# Patient Record
Sex: Male | Born: 1960 | Race: Black or African American | Hispanic: No | Smoking: Former smoker
Health system: Southern US, Community
[De-identification: ages and names within clinical notes are randomized; demographics above are authoritative.]

## PROBLEM LIST (undated history)

## (undated) DIAGNOSIS — J309 Allergic rhinitis, unspecified: Secondary | ICD-10-CM

## (undated) DIAGNOSIS — K219 Gastro-esophageal reflux disease without esophagitis: Secondary | ICD-10-CM

## (undated) HISTORY — PX: BACK SURGERY: SHX140

## (undated) HISTORY — PX: LAMINECTOMY AND MICRODISCECTOMY LUMBAR SPINE: SHX1913

## (undated) HISTORY — PX: HERNIA REPAIR: SHX51

## (undated) HISTORY — PX: FRACTURE SURGERY: SHX138

## (undated) HISTORY — PX: EYE SURGERY: SHX253

## (undated) HISTORY — PX: ORIF ANKLE FRACTURE: SUR919

---

## 2004-07-03 ENCOUNTER — Ambulatory Visit: Payer: Self-pay | Admitting: Unknown Physician Specialty

## 2006-02-08 ENCOUNTER — Emergency Department: Payer: Self-pay | Admitting: Internal Medicine

## 2006-11-07 ENCOUNTER — Emergency Department: Payer: Self-pay

## 2006-11-07 ENCOUNTER — Other Ambulatory Visit: Payer: Self-pay

## 2008-02-11 ENCOUNTER — Other Ambulatory Visit: Payer: Self-pay

## 2008-02-11 ENCOUNTER — Emergency Department: Payer: Self-pay | Admitting: Emergency Medicine

## 2008-06-09 ENCOUNTER — Emergency Department: Payer: Self-pay

## 2009-08-15 ENCOUNTER — Emergency Department: Payer: Self-pay | Admitting: Emergency Medicine

## 2010-06-05 ENCOUNTER — Emergency Department: Payer: Self-pay | Admitting: Emergency Medicine

## 2010-06-09 ENCOUNTER — Observation Stay: Payer: Self-pay | Admitting: Surgery

## 2010-09-04 ENCOUNTER — Emergency Department: Payer: Self-pay | Admitting: Emergency Medicine

## 2011-03-16 ENCOUNTER — Emergency Department: Payer: Self-pay | Admitting: Emergency Medicine

## 2011-06-18 ENCOUNTER — Emergency Department: Payer: Self-pay | Admitting: Emergency Medicine

## 2013-11-13 ENCOUNTER — Emergency Department: Payer: Self-pay | Admitting: Emergency Medicine

## 2013-11-13 LAB — TROPONIN I: Troponin-I: <0.02 ng/mL

## 2013-11-13 LAB — URINALYSIS, COMPLETE
BLOOD: NEGATIVE
Bacteria: NONE SEEN
Bilirubin,UR: NEGATIVE
Glucose,UR: NEGATIVE mg/dL (ref 0–75)
KETONE: NEGATIVE
Leukocyte Esterase: NEGATIVE
NITRITE: NEGATIVE
PH: 6 (ref 4.5–8.0)
Protein: NEGATIVE
RBC,UR: 1 /HPF (ref 0–5)
SPECIFIC GRAVITY: 1.017 (ref 1.003–1.030)
Squamous Epithelial: NONE SEEN

## 2013-11-13 LAB — CBC WITH DIFFERENTIAL/PLATELET
BASOS ABS: 0.1 10*3/uL (ref 0.0–0.1)
BASOS PCT: 1.2 %
Eosinophil #: 0.1 10*3/uL (ref 0.0–0.7)
Eosinophil %: 1 %
HCT: 41.4 % (ref 40.0–52.0)
HGB: 13.8 g/dL (ref 13.0–18.0)
LYMPHS ABS: 3.8 10*3/uL — AB (ref 1.0–3.6)
Lymphocyte %: 37.9 %
MCH: 27.7 pg (ref 26.0–34.0)
MCHC: 33.3 g/dL (ref 32.0–36.0)
MCV: 83 fL (ref 80–100)
MONOS PCT: 7.7 %
Monocyte #: 0.8 x10 3/mm (ref 0.2–1.0)
NEUTROS ABS: 5.3 10*3/uL (ref 1.4–6.5)
Neutrophil %: 52.2 %
PLATELETS: 269 10*3/uL (ref 150–440)
RBC: 4.99 10*6/uL (ref 4.40–5.90)
RDW: 13.3 % (ref 11.5–14.5)
WBC: 10.1 10*3/uL (ref 3.8–10.6)

## 2013-11-13 LAB — COMPREHENSIVE METABOLIC PANEL
ALK PHOS: 71 U/L
ANION GAP: 2 — AB (ref 7–16)
Albumin: 4 g/dL (ref 3.4–5.0)
BILIRUBIN TOTAL: 0.3 mg/dL (ref 0.2–1.0)
BUN: 14 mg/dL (ref 7–18)
CHLORIDE: 105 mmol/L (ref 98–107)
CREATININE: 1.15 mg/dL (ref 0.60–1.30)
Calcium, Total: 8.7 mg/dL (ref 8.5–10.1)
Co2: 32 mmol/L (ref 21–32)
EGFR (African American): >60
Glucose: 87 mg/dL (ref 65–99)
OSMOLALITY: 277 (ref 275–301)
POTASSIUM: 3.6 mmol/L (ref 3.5–5.1)
SGOT(AST): 23 U/L (ref 15–37)
SGPT (ALT): 24 U/L (ref 12–78)
SODIUM: 139 mmol/L (ref 136–145)
TOTAL PROTEIN: 7.9 g/dL (ref 6.4–8.2)

## 2013-11-13 LAB — LIPASE, BLOOD: LIPASE: 128 U/L (ref 73–393)

## 2013-12-08 DIAGNOSIS — G4733 Obstructive sleep apnea (adult) (pediatric): Secondary | ICD-10-CM | POA: Insufficient documentation

## 2014-03-23 ENCOUNTER — Emergency Department: Payer: Self-pay | Admitting: Emergency Medicine

## 2014-08-13 ENCOUNTER — Emergency Department: Payer: Self-pay | Admitting: Emergency Medicine

## 2015-10-04 ENCOUNTER — Emergency Department
Admission: EM | Admit: 2015-10-04 | Discharge: 2015-10-04 | Disposition: A | Discharge status: Home / Self Care | Payer: 59 | Attending: Emergency Medicine | Admitting: Emergency Medicine

## 2015-10-04 ENCOUNTER — Encounter: Payer: Self-pay | Admitting: *Deleted

## 2015-10-04 ENCOUNTER — Emergency Department: Payer: 59

## 2015-10-04 DIAGNOSIS — R42 Dizziness and giddiness: Secondary | ICD-10-CM | POA: Insufficient documentation

## 2015-10-04 DIAGNOSIS — K219 Gastro-esophageal reflux disease without esophagitis: Secondary | ICD-10-CM | POA: Diagnosis not present

## 2015-10-04 DIAGNOSIS — F17210 Nicotine dependence, cigarettes, uncomplicated: Secondary | ICD-10-CM | POA: Insufficient documentation

## 2015-10-04 DIAGNOSIS — K824 Cholesterolosis of gallbladder: Secondary | ICD-10-CM | POA: Insufficient documentation

## 2015-10-04 DIAGNOSIS — R079 Chest pain, unspecified: Secondary | ICD-10-CM | POA: Diagnosis not present

## 2015-10-04 DIAGNOSIS — R1013 Epigastric pain: Secondary | ICD-10-CM

## 2015-10-04 LAB — CBC
HEMATOCRIT: 40.4 % (ref 40.0–52.0)
Hemoglobin: 13.6 g/dL (ref 13.0–18.0)
MCH: 27.9 pg (ref 26.0–34.0)
MCHC: 33.6 g/dL (ref 32.0–36.0)
MCV: 83 fL (ref 80.0–100.0)
PLATELETS: 243 10*3/uL (ref 150–440)
RBC: 4.87 MIL/uL (ref 4.40–5.90)
RDW: 13.3 % (ref 11.5–14.5)
WBC: 10.2 10*3/uL (ref 3.8–10.6)

## 2015-10-04 LAB — BASIC METABOLIC PANEL
Anion gap: 6 (ref 5–15)
BUN: 16 mg/dL (ref 6–20)
CALCIUM: 9.1 mg/dL (ref 8.9–10.3)
CO2: 26 mmol/L (ref 22–32)
CREATININE: 1.04 mg/dL (ref 0.61–1.24)
Chloride: 106 mmol/L (ref 101–111)
GFR calc Af Amer: >60 mL/min (ref >60)
GFR calc non Af Amer: >60 mL/min (ref >60)
GLUCOSE: 145 mg/dL — AB (ref 65–99)
Potassium: 3.6 mmol/L (ref 3.5–5.1)
Sodium: 138 mmol/L (ref 135–145)

## 2015-10-04 LAB — HEPATIC FUNCTION PANEL
ALK PHOS: 61 U/L (ref 38–126)
ALT: 15 U/L — AB (ref 17–63)
AST: 19 U/L (ref 15–41)
Albumin: 4.1 g/dL (ref 3.5–5.0)
BILIRUBIN TOTAL: 0.6 mg/dL (ref 0.3–1.2)
Total Protein: 7.2 g/dL (ref 6.5–8.1)

## 2015-10-04 LAB — TROPONIN I
Troponin I: 0.03 ng/mL (ref <0.031)
Troponin I: <0.03 ng/mL (ref <0.031)

## 2015-10-04 LAB — LIPASE, BLOOD: LIPASE: 19 U/L (ref 11–51)

## 2015-10-04 MED ORDER — PANTOPRAZOLE SODIUM 40 MG PO TBEC: 40.0000 mg | DELAYED_RELEASE_TABLET | Freq: Every day | ORAL | Status: DC

## 2015-10-04 MED ORDER — PANTOPRAZOLE SODIUM 40 MG IV SOLR
40.0000 mg | Freq: Once | INTRAVENOUS | Status: AC
  Administered 2015-10-04: 40 mg via INTRAVENOUS
  Filled 2015-10-04: qty 40

## 2015-10-04 NOTE — Discharge Instructions (Signed)
1. Start Protonix 40 mg daily (#30). 2. Eat a bland diet 5 days, then slowly advance as tolerated. 3. Return to the ER for worsening symptoms, persistent vomiting, difficulty breathing or other concerns.  Today your ultrasound was notable for gallbladder polyp. The gallbladder polyp may have contributed to the symptoms that you had today. The other concern is that gallbladder polyps can occasionally become cancerous. It is important for you to follow up with Dr. Adonis Huguenin, the general surgeon on-call, to discharge discuss management of her gallbladder polyp. Please call to make an appointment.  Nonspecific Chest Pain  Chest pain can be caused by many different conditions. There is always a chance that your pain could be related to something serious, such as a heart attack or a blood clot in your lungs. Chest pain can also be caused by conditions that are not life-threatening. If you have chest pain, it is very important to follow up with your health care provider. CAUSES  Chest pain can be caused by:  Heartburn.  Pneumonia or bronchitis.  Anxiety or stress.  Inflammation around your heart (pericarditis) or lung (pleuritis or pleurisy).  A blood clot in your lung.  A collapsed lung (pneumothorax). It can develop suddenly on its own (spontaneous pneumothorax) or from trauma to the chest.  Shingles infection (varicella-zoster virus).  Heart attack.  Damage to the bones, muscles, and cartilage that make up your chest wall. This can include:  Bruised bones due to injury.  Strained muscles or cartilage due to frequent or repeated coughing or overwork.  Fracture to one or more ribs.  Sore cartilage due to inflammation (costochondritis). RISK FACTORS  Risk factors for chest pain may include:  Activities that increase your risk for trauma or injury to your chest.  Respiratory infections or conditions that cause frequent coughing.  Medical conditions or overeating that can cause  heartburn.  Heart disease or family history of heart disease.  Conditions or health behaviors that increase your risk of developing a blood clot.  Having had chicken pox (varicella zoster). SIGNS AND SYMPTOMS Chest pain can feel like:  Burning or tingling on the surface of your chest or deep in your chest.  Crushing, pressure, aching, or squeezing pain.  Dull or sharp pain that is worse when you move, cough, or take a deep breath.  Pain that is also felt in your back, neck, shoulder, or arm, or pain that spreads to any of these areas. Your chest pain may come and go, or it may stay constant. DIAGNOSIS Lab tests or other studies may be needed to find the cause of your pain. Your health care provider may have you take a test called an ambulatory ECG (electrocardiogram). An ECG records your heartbeat patterns at the time the test is performed. You may also have other tests, such as:  Transthoracic echocardiogram (TTE). During echocardiography, sound waves are used to create a picture of all of the heart structures and to look at how blood flows through your heart.  Transesophageal echocardiogram (TEE).This is a more advanced imaging test that obtains images from inside your body. It allows your health care provider to see your heart in finer detail.  Cardiac monitoring. This allows your health care provider to monitor your heart rate and rhythm in real time.  Holter monitor. This is a portable device that records your heartbeat and can help to diagnose abnormal heartbeats. It allows your health care provider to track your heart activity for several days, if needed.  Stress  tests. These can be done through exercise or by taking medicine that makes your heart beat more quickly.  Blood tests.  Imaging tests. TREATMENT  Your treatment depends on what is causing your chest pain. Treatment may include:  Medicines. These may include:  Acid blockers for heartburn.  Anti-inflammatory  medicine.  Pain medicine for inflammatory conditions.  Antibiotic medicine, if an infection is present.  Medicines to dissolve blood clots.  Medicines to treat coronary artery disease.  Supportive care for conditions that do not require medicines. This may include:  Resting.  Applying heat or cold packs to injured areas.  Limiting activities until pain decreases. HOME CARE INSTRUCTIONS  If you were prescribed an antibiotic medicine, finish it all even if you start to feel better.  Avoid any activities that bring on chest pain.  Do not use any tobacco products, including cigarettes, chewing tobacco, or electronic cigarettes. If you need help quitting, ask your health care provider.  Do not drink alcohol.  Take medicines only as directed by your health care provider.  Keep all follow-up visits as directed by your health care provider. This is important. This includes any further testing if your chest pain does not go away.  If heartburn is the cause for your chest pain, you may be told to keep your head raised (elevated) while sleeping. This reduces the chance that acid will go from your stomach into your esophagus.  Make lifestyle changes as directed by your health care provider. These may include:  Getting regular exercise. Ask your health care provider to suggest some activities that are safe for you.  Eating a heart-healthy diet. A registered dietitian can help you to learn healthy eating options.  Maintaining a healthy weight.  Managing diabetes, if necessary.  Reducing stress. SEEK MEDICAL CARE IF:  Your chest pain does not go away after treatment.  You have a rash with blisters on your chest.  You have a fever. SEEK IMMEDIATE MEDICAL CARE IF:   Your chest pain is worse.  You have an increasing cough, or you cough up blood.  You have severe abdominal pain.  You have severe weakness.  You faint.  You have chills.  You have sudden, unexplained chest  discomfort.  You have sudden, unexplained discomfort in your arms, back, neck, or jaw.  You have shortness of breath at any time.  You suddenly start to sweat, or your skin gets clammy.  You feel nauseous or you vomit.  You suddenly feel light-headed or dizzy.  Your heart begins to beat quickly, or it feels like it is skipping beats. These symptoms may represent a serious problem that is an emergency. Do not wait to see if the symptoms will go away. Get medical help right away. Call your local emergency services (911 in the [place] Do not drive yourself to the hospital.   This information is not intended to replace advice given to you by your health care provider. Make sure you discuss any questions you have with your health care provider.   Document Released: 05/19/2005 Document Revised: 08/30/2014 Document Reviewed: 03/15/2014 Elsevier Interactive Patient Education 2016 [employer]  Abdominal Pain, Adult Many things can cause abdominal pain. Usually, abdominal pain is not caused by a disease and will improve without treatment. It can often be observed and treated at home. Your health care provider will do a physical exam and possibly order blood tests and X-rays to help determine the seriousness of your pain. However, in many cases, more time  must pass before a clear cause of the pain can be found. Before that point, your health care provider may not know if you need more testing or further treatment. HOME CARE INSTRUCTIONS Monitor your abdominal pain for any changes. The following actions may help to alleviate any discomfort you are experiencing:  Only take over-the-counter or prescription medicines as directed by your health care provider.  Do not take laxatives unless directed to do so by your health care provider.  Try a clear liquid diet (broth, tea, or water) as directed by your health care provider. Slowly move to a bland diet as tolerated. SEEK MEDICAL CARE IF:  You have  unexplained abdominal pain.  You have abdominal pain associated with nausea or diarrhea.  You have pain when you urinate or have a bowel movement.  You experience abdominal pain that wakes you in the night.  You have abdominal pain that is worsened or improved by eating food.  You have abdominal pain that is worsened with eating fatty foods.  You have a fever. SEEK IMMEDIATE MEDICAL CARE IF:  Your pain does not go away within 2 hours.  You keep throwing up (vomiting).  Your pain is felt only in portions of the abdomen, such as the right side or the left lower portion of the abdomen.  You pass bloody or black tarry stools. MAKE SURE YOU:  Understand these instructions.  Will watch your condition.  Will get help right away if you are not doing well or get worse.   This information is not intended to replace advice given to you by your health care provider. Make sure you discuss any questions you have with your health care provider.   Document Released: 05/19/2005 Document Revised: 04/30/2015 Document Reviewed: 04/18/2013 Elsevier Interactive Patient Education 2016 [place].  Gastroesophageal Reflux Disease, Adult Normally, food travels down the esophagus and stays in the stomach to be digested. However, when a person has gastroesophageal reflux disease (GERD), food and stomach acid move back up into the esophagus. When this happens, the esophagus becomes sore and inflamed. Over time, GERD can create small holes (ulcers) in the lining of the esophagus.  CAUSES This condition is caused by a problem with the muscle between the esophagus and the stomach (lower esophageal sphincter, or LES). Normally, the LES muscle closes after food passes through the esophagus to the stomach. When the LES is weakened or abnormal, it does not close properly, and that allows food and stomach acid to go back up into the esophagus. The LES can be weakened by certain dietary substances, medicines,  and medical conditions, including:  Tobacco use.  Pregnancy.  Having a hiatal hernia.  Heavy alcohol use.  Certain foods and beverages, such as coffee, chocolate, onions, and peppermint. RISK FACTORS This condition is more likely to develop in:  People who have an increased body weight.  People who have connective tissue disorders.  People who use NSAID medicines. SYMPTOMS Symptoms of this condition include:  Heartburn.  Difficult or painful swallowing.  The feeling of having a lump in the throat.  Abitter taste in the mouth.  Bad breath.  Having a large amount of saliva.  Having an upset or bloated stomach.  Belching.  Chest pain.  Shortness of breath or wheezing.  Ongoing (chronic) cough or a night-time cough.  Wearing away of tooth enamel.  Weight loss. Different conditions can cause chest pain. Make sure to see your health care provider if you experience chest pain. DIAGNOSIS Your  health care provider will take a medical history and perform a physical exam. To determine if you have mild or severe GERD, your health care provider may also monitor how you respond to treatment. You may also have other tests, including:  An endoscopy toexamine your stomach and esophagus with a small camera.  A test thatmeasures the acidity level in your esophagus.  A test thatmeasures how much pressure is on your esophagus.  A barium swallow or modified barium swallow to show the shape, size, and functioning of your esophagus. TREATMENT The goal of treatment is to help relieve your symptoms and to prevent complications. Treatment for this condition may vary depending on how severe your symptoms are. Your health care provider may recommend:  Changes to your diet.  Medicine.  Surgery. HOME CARE INSTRUCTIONS Diet  Follow a diet as recommended by your health care provider. This may involve avoiding foods and drinks such as:  Coffee and tea (with or without  caffeine).  Drinks that containalcohol.  Energy drinks and sports drinks.  Carbonated drinks or sodas.  Chocolate and cocoa.  Peppermint and mint flavorings.  Garlic and onions.  Horseradish.  Spicy and acidic foods, including peppers, chili powder, curry powder, vinegar, hot sauces, and barbecue sauce.  Citrus fruit juices and citrus fruits, such as oranges, lemons, and limes.  Tomato-based foods, such as red sauce, chili, salsa, and pizza with red sauce.  Fried and fatty foods, such as donuts, french fries, potato chips, and high-fat dressings.  High-fat meats, such as hot dogs and fatty cuts of red and white meats, such as rib eye steak, sausage, ham, and bacon.  High-fat dairy items, such as whole milk, butter, and cream cheese.  Eat small, frequent meals instead of large meals.  Avoid drinking large amounts of liquid with your meals.  Avoid eating meals during the 2-3 hours before bedtime.  Avoid lying down right after you eat.  Do not exercise right after you eat. General Instructions  Pay attention to any changes in your symptoms.  Take over-the-counter and prescription medicines only as told by your health care provider. Do not take aspirin, ibuprofen, or other NSAIDs unless your health care provider told you to do so.  Do not use any tobacco products, including cigarettes, chewing tobacco, and e-cigarettes. If you need help quitting, ask your health care provider.  Wear loose-fitting clothing. Do not wear anything tight around your waist that causes pressure on your abdomen.  Raise (elevate) the head of your bed 6 inches (15cm).  Try to reduce your stress, such as with yoga or meditation. If you need help reducing stress, ask your health care provider.  If you are overweight, reduce your weight to an amount that is healthy for you. Ask your health care provider for guidance about a safe weight loss goal.  Keep all follow-up visits as told by your  health care provider. This is important. SEEK MEDICAL CARE IF:  You have new symptoms.  You have unexplained weight loss.  You have difficulty swallowing, or it hurts to swallow.  You have wheezing or a persistent cough.  Your symptoms do not improve with treatment.  You have a hoarse voice. SEEK IMMEDIATE MEDICAL CARE IF:  You have pain in your arms, neck, jaw, teeth, or back.  You feel sweaty, dizzy, or light-headed.  You have chest pain or shortness of breath.  You vomit and your vomit looks like blood or coffee grounds.  You faint.  Your stool is  bloody or black.  You cannot swallow, drink, or eat.   This information is not intended to replace advice given to you by your health care provider. Make sure you discuss any questions you have with your health care provider.   Document Released: 05/19/2005 Document Revised: 04/30/2015 Document Reviewed: 12/04/2014 Elsevier Interactive Patient Education 2016 [place] for Gastroesophageal Reflux Disease, Adult When you have gastroesophageal reflux disease (GERD), the foods you eat and your eating habits are very important. Choosing the right foods can help ease the discomfort of GERD. WHAT GENERAL GUIDELINES DO I NEED TO FOLLOW?  Choose fruits, vegetables, whole grains, low-fat dairy products, and low-fat meat, fish, and poultry.  Limit fats such as oils, salad dressings, butter, nuts, and avocado.  Keep a food diary to identify foods that cause symptoms.  Avoid foods that cause reflux. These may be different for different people.  Eat frequent small meals instead of three large meals each day.  Eat your meals slowly, in a relaxed setting.  Limit fried foods.  Cook foods using methods other than frying.  Avoid drinking alcohol.  Avoid drinking large amounts of liquids with your meals.  Avoid bending over or lying down until 2-3 hours after eating. WHAT FOODS ARE NOT RECOMMENDED? The  following are some foods and drinks that may worsen your symptoms: Vegetables Tomatoes. Tomato juice. Tomato and spaghetti sauce. Chili peppers. Onion and garlic. Horseradish. Fruits Oranges, grapefruit, and lemon (fruit and juice). Meats High-fat meats, fish, and poultry. This includes hot dogs, ribs, ham, sausage, salami, and bacon. Dairy Whole milk and chocolate milk. Sour cream. Cream. Butter. Ice cream. Cream cheese.  Beverages Coffee and tea, with or without caffeine. Carbonated beverages or energy drinks. Condiments Hot sauce. Barbecue sauce.  Sweets/Desserts Chocolate and cocoa. Donuts. Peppermint and spearmint. Fats and Oils High-fat foods, including [place] fries and potato chips. Other Vinegar. Strong spices, such as black pepper, white pepper, red pepper, cayenne, curry powder, cloves, ginger, and chili powder. The items listed above may not be a complete list of foods and beverages to avoid. Contact your dietitian for more information.   This information is not intended to replace advice given to you by your health care provider. Make sure you discuss any questions you have with your health care provider.   Document Released: 08/09/2005 Document Revised: 08/30/2014 Document Reviewed: 06/13/2013 Elsevier Interactive Patient Education [employer].

## 2015-10-04 NOTE — ED Notes (Signed)
Pt presents w/ multiple vague c/o chest pain that woke him from sleep. Pt has associated sxs of lightheadedness, dizziness, sensation of shortness of breath. Pt states he had eaten a large meal of fried food. Pt c/o some diarrhea after eating meal. Pt states he may have had palpitations upon waking. Pt has hx of GERD.  Pt drove self to ED, in no acute respiratory distress at this time, ambulatory to triage.

## 2015-10-04 NOTE — ED Provider Notes (Signed)
[facility] Emergency Department Provider Note  ____________________________________________  Time seen: Approximately 3:52 AM  I have reviewed the triage vital signs and the nursing notes.   HISTORY  Chief Complaint Chest Pain    HPI Luis Williams is a 55 y.o. male who presents to the ED from home with multiple medical complaints. Patient reports he ate a very large meal of fried foods last evening. Awoke approximately 1:30 AM with epigastric discomfort, chest discomfort, sensation of feeling lightheaded, dizzy, short of breath. States he may or may not have had palpitations upon awakening.Denies associated diaphoresis, nausea or vomiting. Denies recent fever, chills, diarrhea, dysuria. Denies recent travel or trauma. Nothing makes his sensations worse. Without intervention, patient feels better.   Past medical history Obstructive sleep apnea None for CAD, hypertension or diabetes  There are no active problems to display for this patient.   Past surgical history Hernia Repair    Sinus Surgery    Back Surgery      Current Outpatient Rx  Name  Route  Sig  Dispense  Refill  . pantoprazole (PROTONIX) 40 MG tablet   Oral   Take 1 tablet (40 mg total) by mouth daily.   30 tablet   0     Allergies Review of patient's allergies indicates no known allergies.  Family history None for CAD  Social History Social History  Substance Use Topics  . Smoking status: Light Tobacco Smoker    Types: Cigars  . Smokeless tobacco: Never Used  . Alcohol Use: Yes     Comment: rarely    Review of Systems Constitutional: No fever/chills Eyes: No visual changes. ENT: No sore throat. Cardiovascular: Positive for chest pain. Respiratory: Denies shortness of breath. Gastrointestinal: Positive for abdominal pain.  No nausea, no vomiting.  No diarrhea.  No constipation. Genitourinary: Negative for dysuria. Musculoskeletal: Negative for back pain. Skin:  Negative for rash. Neurological: Positive for dizziness. Negative for headaches, focal weakness or numbness.  10-point ROS otherwise negative.  ____________________________________________   PHYSICAL EXAM:  VITAL SIGNS: ED Triage Vitals  Enc Vitals Group     BP 10/04/15 0302 149/85 mmHg     Pulse Rate 10/04/15 0302 66     Resp 10/04/15 0302 20     Temp 10/04/15 0302 98.2 F (36.8 C)     Temp Source 10/04/15 0302 Oral     SpO2 10/04/15 0302 97 %     Weight 10/04/15 0302 244 lb (110.678 kg)     Height 10/04/15 0302 5\' 11"  (1.803 m)     Head Cir --      Peak Flow --      Pain Score 10/04/15 0303 6     Pain Loc --      Pain Edu? --      Excl. in [place]? --     Constitutional: Alert and oriented. Well appearing and in no acute distress. Eyes: Conjunctivae are normal. PERRL. EOMI. Head: Atraumatic. Nose: No congestion/rhinnorhea. Mouth/Throat: Mucous membranes are moist.  Oropharynx non-erythematous. Neck: No stridor.   Cardiovascular: Normal rate, regular rhythm. Grossly normal heart sounds.  Good peripheral circulation. Respiratory: Normal respiratory effort.  No retractions. Lungs CTAB. Gastrointestinal: Soft and mildly tender to palpation epigastrium without rebound or guarding. No distention. No abdominal bruits. No CVA tenderness. Musculoskeletal: No lower extremity tenderness nor edema.  No joint effusions. Neurologic:  Normal speech and language. No gross focal neurologic deficits are appreciated. No gait instability. Skin:  Skin is warm, dry  and intact. No rash noted. Psychiatric: Mood and affect are normal. Speech and behavior are normal.  ____________________________________________   LABS (all labs ordered are listed, but only abnormal results are displayed)  Labs Reviewed  BASIC METABOLIC PANEL - Abnormal; Notable for the following:    Glucose, Bld 145 (*)    All other components within normal limits  HEPATIC FUNCTION PANEL - Abnormal; Notable for the  following:    ALT 15 (*)    Bilirubin, Direct <0.1 (*)    All other components within normal limits  CBC  TROPONIN I  LIPASE, BLOOD  TROPONIN I   ____________________________________________  EKG  ED ECG REPORT I, SUNG,JADE J, the attending physician, personally viewed and interpreted this ECG.   Date: 10/04/2015  EKG Time: 0303  Rate: 58  Rhythm: sinus bradycardia  Axis: Normal  Intervals:none  ST&T Change: Nonspecific  ____________________________________________  RADIOLOGY  Chest 2 view (viewed by me, interpreted per Dr. Quintella Reichert): No active cardiopulmonary disease.  Limited abdominal ultrasound interpreted per Dr. Pascal Lux: 1. No acute finding. 2. 2 mm gallbladder polyp. 3. Hepatic steatosis. ____________________________________________   PROCEDURES  Procedure(s) performed: None  Critical Care performed: No  ____________________________________________   INITIAL IMPRESSION / ASSESSMENT AND PLAN / ED COURSE  Pertinent labs & imaging results that were available during my care of the patient were reviewed by me and considered in my medical decision making (see chart for details).  55 year old male without significant past medical history who presents with epigastric abdominal pain and chest discomfort. Given recent ingestion of heavy amounts of fried food, will add liver enzymes and lipase, obtain right upper quadrant abdominal ultrasound; administer IV Protonix and reassess. Will repeat second timed troponin.  ----------------------------------------- 5:33 AM on 10/04/2015 -----------------------------------------  Patient sleeping in no acute distress. Awaiting ultrasound.  ----------------------------------------- 7:23 AM on 10/04/2015 -----------------------------------------  Updated patient on ultrasound results. Repeat troponin pending. Care transferred to Dr. Mariea Clonts. Anticipate discharge home with close cardiology follow-up if repeat troponin  remains negative. ____________________________________________   FINAL CLINICAL IMPRESSION(S) / ED DIAGNOSES  Final diagnoses:  Chest pain, unspecified chest pain type  Epigastric pain  Gastroesophageal reflux disease, esophagitis presence not specified      Paulette Blanch, MD 10/04/15 [phone]

## 2015-10-04 NOTE — ED Notes (Signed)
MD Sung at bedside. 

## 2015-10-04 NOTE — ED Notes (Signed)
Patient transported to Ultrasound 

## 2015-10-28 DIAGNOSIS — E119 Type 2 diabetes mellitus without complications: Secondary | ICD-10-CM

## 2015-10-28 DIAGNOSIS — K824 Cholesterolosis of gallbladder: Secondary | ICD-10-CM | POA: Insufficient documentation

## 2015-10-28 HISTORY — DX: Cholesterolosis of gallbladder: K82.4

## 2015-10-28 HISTORY — DX: Type 2 diabetes mellitus without complications: E11.9

## 2015-11-11 DIAGNOSIS — E7800 Pure hypercholesterolemia, unspecified: Secondary | ICD-10-CM | POA: Insufficient documentation

## 2015-11-11 HISTORY — DX: Pure hypercholesterolemia, unspecified: E78.00

## 2016-01-10 ENCOUNTER — Emergency Department: Payer: 59

## 2016-01-10 ENCOUNTER — Emergency Department
Admission: EM | Admit: 2016-01-10 | Discharge: 2016-01-10 | Disposition: A | Discharge status: Home / Self Care | Payer: 59 | Attending: Emergency Medicine | Admitting: Emergency Medicine

## 2016-01-10 DIAGNOSIS — Z79899 Other long term (current) drug therapy: Secondary | ICD-10-CM | POA: Diagnosis not present

## 2016-01-10 DIAGNOSIS — R079 Chest pain, unspecified: Secondary | ICD-10-CM | POA: Insufficient documentation

## 2016-01-10 DIAGNOSIS — H109 Unspecified conjunctivitis: Secondary | ICD-10-CM | POA: Diagnosis not present

## 2016-01-10 DIAGNOSIS — J069 Acute upper respiratory infection, unspecified: Secondary | ICD-10-CM | POA: Diagnosis not present

## 2016-01-10 DIAGNOSIS — E785 Hyperlipidemia, unspecified: Secondary | ICD-10-CM | POA: Diagnosis not present

## 2016-01-10 DIAGNOSIS — E119 Type 2 diabetes mellitus without complications: Secondary | ICD-10-CM | POA: Insufficient documentation

## 2016-01-10 DIAGNOSIS — F17210 Nicotine dependence, cigarettes, uncomplicated: Secondary | ICD-10-CM | POA: Diagnosis not present

## 2016-01-10 DIAGNOSIS — Z7984 Long term (current) use of oral hypoglycemic drugs: Secondary | ICD-10-CM | POA: Insufficient documentation

## 2016-01-10 LAB — HEPATIC FUNCTION PANEL
ALT: 14 U/L — ABNORMAL LOW (ref 17–63)
AST: 19 U/L (ref 15–41)
Albumin: 3.9 g/dL (ref 3.5–5.0)
Alkaline Phosphatase: 59 U/L (ref 38–126)
Bilirubin, Direct: <0.1 mg/dL — ABNORMAL LOW (ref 0.1–0.5)
Total Bilirubin: 0.6 mg/dL (ref 0.3–1.2)
Total Protein: 6.8 g/dL (ref 6.5–8.1)

## 2016-01-10 LAB — BASIC METABOLIC PANEL
ANION GAP: 5 (ref 5–15)
BUN: 14 mg/dL (ref 6–20)
CALCIUM: 9.3 mg/dL (ref 8.9–10.3)
CO2: 28 mmol/L (ref 22–32)
Chloride: 107 mmol/L (ref 101–111)
Creatinine, Ser: 1.21 mg/dL (ref 0.61–1.24)
GLUCOSE: 175 mg/dL — AB (ref 65–99)
Potassium: 3.5 mmol/L (ref 3.5–5.1)
SODIUM: 140 mmol/L (ref 135–145)

## 2016-01-10 LAB — CBC
HCT: 42 % (ref 40.0–52.0)
HEMOGLOBIN: 14.2 g/dL (ref 13.0–18.0)
MCH: 27.5 pg (ref 26.0–34.0)
MCHC: 33.8 g/dL (ref 32.0–36.0)
MCV: 81.5 fL (ref 80.0–100.0)
Platelets: 163 10*3/uL (ref 150–440)
RBC: 5.15 MIL/uL (ref 4.40–5.90)
RDW: 13.4 % (ref 11.5–14.5)
WBC: 11.8 10*3/uL — ABNORMAL HIGH (ref 3.8–10.6)

## 2016-01-10 LAB — TROPONIN I: Troponin I: <0.03 ng/mL

## 2016-01-10 LAB — LIPASE, BLOOD: Lipase: 18 U/L (ref 11–51)

## 2016-01-10 MED ORDER — GI COCKTAIL ~~LOC~~
30.0000 mL | Freq: Once | ORAL | Status: AC
  Administered 2016-01-10: 30 mL via ORAL
  Filled 2016-01-10: qty 30

## 2016-01-10 MED ORDER — TOBRAMYCIN 0.3 % OP SOLN
2.0000 [drp] | Freq: Once | OPHTHALMIC | Status: AC
  Administered 2016-01-10: 2 [drp] via OPHTHALMIC
  Filled 2016-01-10: qty 5

## 2016-01-10 MED ORDER — AZITHROMYCIN 250 MG PO TABS: 250.0000 mg | ORAL_TABLET | Freq: Every day | ORAL | Status: DC

## 2016-01-10 NOTE — ED Notes (Signed)
Pt ambulatory to triage with no difficulty. Pt reports chest pain intermittently for several days. States it feels like indigestion. States he has occasionally felt short of breath but also reports sore throat and congestion. Pt denies hx of heart problems except for "irregular heartbeat". Pt does report hx of diabetes for which he takes oral medication. Pt talking in full and complete sentences with no difficulty at this time. Pt also reports his eyes are bothering him and feel swollen. States "I think I have pink eye". Pts eyes are slightly red at this time.

## 2016-01-10 NOTE — ED Provider Notes (Signed)
[facility] Emergency Department Provider Note   ____________________________________________  Time seen: Approximately 4:48 AM  I have reviewed the triage vital signs and the nursing notes.   HISTORY  Chief Complaint Chest Pain    HPI Luis Williams is a 55 y.o. male who presents to the ED from home with multiple medical complaints. Patient has a history of diabetes and hyperlipidemia who reports sinus pressure, congestion, bilateral eye discharge, chest pain, indigestion 3 days. Reports constant feeling of chest pain and indigestion without associated diaphoresis, nausea, vomiting, shortness of breath, palpitations. Thinks he has pinkeye because of the discharge from both eyes. Also has cough with light greenish sputum. Presents to the emergency department this morning because his symptoms are not improving nor have they gotten worse. Denies associated fever, chills, abdominal pain, diarrhea. Denies recent travel or trauma. Nothing makes his symptoms better or worse.   Past medical history Diabetes Hyperlipidemia   There are no active problems to display for this patient.   No past surgical history on file.  Current Outpatient Rx  Name  Route  Sig  Dispense  Refill  . azithromycin (ZITHROMAX) 250 MG tablet   Oral   Take 1 tablet (250 mg total) by mouth daily.   4 each   0   . metFORMIN (GLUCOPHAGE) 500 MG tablet   Oral   Take 500 mg by mouth 2 (two) times daily.         . pantoprazole (PROTONIX) 40 MG tablet   Oral   Take 1 tablet (40 mg total) by mouth daily.   30 tablet   0   . simvastatin (ZOCOR) 20 MG tablet   Oral   Take 20 mg by mouth daily.           Allergies Review of patient's allergies indicates no known allergies.  Family history None for CAD  Social History Social History  Substance Use Topics  . Smoking status: Light Tobacco Smoker    Types: Cigars  . Smokeless tobacco: Never Used  . Alcohol Use: Yes       Comment: rarely    Review of Systems  Constitutional: No fever/chills. Eyes: Positive for bilateral eye discharge. No visual changes. ENT: Positive for sinus congestion. No sore throat. Cardiovascular: Positive for chest pain. Respiratory: Positive for cough. Denies shortness of breath. Gastrointestinal: No abdominal pain.  No nausea, no vomiting.  No diarrhea.  No constipation. Genitourinary: Negative for dysuria. Musculoskeletal: Negative for back pain. Skin: Negative for rash. Neurological: Negative for headaches, focal weakness or numbness.  10-point ROS otherwise negative.  ____________________________________________   PHYSICAL EXAM:  VITAL SIGNS: ED Triage Vitals  Enc Vitals Group     BP 01/10/16 0231 125/77 mmHg     Pulse Rate 01/10/16 0231 73     Resp 01/10/16 0231 18     Temp 01/10/16 0231 97.8 F (36.6 C)     Temp Source 01/10/16 0231 Oral     SpO2 01/10/16 0231 97 %     Weight 01/10/16 0231 249 lb (112.946 kg)     Height 01/10/16 0231 5\' 11"  (1.803 m)     Head Cir --      Peak Flow --      Pain Score 01/10/16 0233 8     Pain Loc --      Pain Edu? --      Excl. in [place]? --     Constitutional: Asleep, easily awakened for exam. Alert and oriented.  Well appearing and in no acute distress. Eyes: Conjunctivae are injected bilaterally. Mild yellow exudate from both eyes. PERRL. EOMI. Head: Atraumatic. Nose: Congestion/rhinnorhea. Mouth/Throat: Mucous membranes are moist.  Oropharynx non-erythematous. Neck: No stridor.  No carotid bruits. Cardiovascular: Normal rate, regular rhythm. Grossly normal heart sounds.  Good peripheral circulation. Respiratory: Normal respiratory effort.  No retractions. Lungs CTAB. Gastrointestinal: Soft and nontender. No distention. No abdominal bruits. No CVA tenderness. Musculoskeletal: No lower extremity tenderness nor edema.  No joint effusions. Neurologic:  Normal speech and language. No gross focal neurologic deficits are  appreciated. No gait instability. Skin:  Skin is warm, dry and intact. No rash noted. Psychiatric: Mood and affect are normal. Speech and behavior are normal.  ____________________________________________   LABS (all labs ordered are listed, but only abnormal results are displayed)  Labs Reviewed  BASIC METABOLIC PANEL - Abnormal; Notable for the following:    Glucose, Bld 175 (*)    All other components within normal limits  CBC - Abnormal; Notable for the following:    WBC 11.8 (*)    All other components within normal limits  HEPATIC FUNCTION PANEL - Abnormal; Notable for the following:    ALT 14 (*)    Bilirubin, Direct <0.1 (*)    All other components within normal limits  TROPONIN I  LIPASE, BLOOD  TROPONIN I   ____________________________________________  EKG  ED ECG REPORT I, Amol Domanski J, the attending physician, personally viewed and interpreted this ECG.   Date: 01/10/2016  EKG Time: 0230  Rate: 79  Rhythm: normal EKG, normal sinus rhythm  Axis: Normal  Intervals:none  ST&T Change: Nonspecific  ____________________________________________  RADIOLOGY  Chest 2 view (viewed by me, interpreted per Dr. Gerilyn Nestle): No active cardiopulmonary disease. ____________________________________________   PROCEDURES  Procedure(s) performed: None  Critical Care performed: No  ____________________________________________   INITIAL IMPRESSION / ASSESSMENT AND PLAN / ED COURSE  Pertinent labs & imaging results that were available during my care of the patient were reviewed by me and considered in my medical decision making (see chart for details).  55 year old male who presents with a 3 day history of sinus congestion, bilateral eye discharge, chest pain and feeling of indigestion. Ate [place] food last evening. Initial troponin negative. Will repeat troponin, had LFTs and lipase and administer GI cocktail. Will  reassess.  ----------------------------------------- 6:53 AM on 01/10/2016 -----------------------------------------  Patient resting in no acute distress. Updated patient of negative repeat troponin and other lab work results. He is overall feeling better. Will continue on azithromycin for the next 4 days, patient instructed to apply Tobrex eyedrops for 1 week, and he will follow up closely with his PCP early next week. Strict return precautions given. Patient verbalizes understanding and agrees with plan of care. ____________________________________________   FINAL CLINICAL IMPRESSION(S) / ED DIAGNOSES  Final diagnoses:  Chest pain, unspecified chest pain type  Bilateral conjunctivitis  URI (upper respiratory infection)      NEW MEDICATIONS STARTED DURING THIS VISIT:  New Prescriptions   AZITHROMYCIN (ZITHROMAX) 250 MG TABLET    Take 1 tablet (250 mg total) by mouth daily.     Note:  This document was prepared using Dragon voice recognition software and may include unintentional dictation errors.    Paulette Blanch, MD 01/10/16 [phone]

## 2016-01-10 NOTE — Discharge Instructions (Signed)
1. Take antibiotic as prescribed (azithromycin 250 mg daily 4 days). 2. Apply antibiotic eyedrops to both eyes every 4 hours while awake x 1 week. 3. Return to the ER for worsening symptoms, persistent vomiting, difficulty breathing or other concerns.  Nonspecific Chest Pain It is often hard to find the cause of chest pain. There is always a chance that your pain could be related to something serious, such as a heart attack or a blood clot in your lungs. Chest pain can also be caused by conditions that are not life-threatening. If you have chest pain, it is very important to follow up with your doctor.  HOME CARE  If you were prescribed an antibiotic medicine, finish it all even if you start to feel better.  Avoid any activities that cause chest pain.  Do not use any tobacco products, including cigarettes, chewing tobacco, or electronic cigarettes. If you need help quitting, ask your doctor.  Do not drink alcohol.  Take medicines only as told by your doctor.  Keep all follow-up visits as told by your doctor. This is important. This includes any further testing if your chest pain does not go away.  Your doctor may tell you to keep your head raised (elevated) while you sleep.  Make lifestyle changes as told by your doctor. These may include:  Getting regular exercise. Ask your doctor to suggest some activities that are safe for you.  Eating a heart-healthy diet. Your doctor or a diet specialist (dietitian) can help you to learn healthy eating options.  Maintaining a healthy weight.  Managing diabetes, if necessary.  Reducing stress. GET HELP IF:  Your chest pain does not go away, even after treatment.  You have a rash with blisters on your chest.  You have a fever. GET HELP RIGHT AWAY IF:  Your chest pain is worse.  You have an increasing cough, or you cough up blood.  You have severe belly (abdominal) pain.  You feel extremely weak.  You pass out (faint).  You  have chills.  You have sudden, unexplained chest discomfort.  You have sudden, unexplained discomfort in your arms, back, neck, or jaw.  You have shortness of breath at any time.  You suddenly start to sweat, or your skin gets clammy.  You feel nauseous.  You vomit.  You suddenly feel light-headed or dizzy.  Your heart begins to beat quickly, or it feels like it is skipping beats. These symptoms may be an emergency. Do not wait to see if the symptoms will go away. Get medical help right away. Call your local emergency services (911 in the [place] Do not drive yourself to the hospital.   This information is not intended to replace advice given to you by your health care provider. Make sure you discuss any questions you have with your health care provider.   Document Released: 01/26/2008 Document Revised: 08/30/2014 Document Reviewed: 03/15/2014 Elsevier Interactive Patient Education 2016 Elsevier Inc.  Upper Respiratory Infection, Adult Most upper respiratory infections (URIs) are caused by a virus. A URI affects the nose, throat, and upper air passages. The most common type of URI is often called "the common cold." HOME CARE   Take medicines only as told by your doctor.  Gargle warm saltwater or take cough drops to comfort your throat as told by your doctor.  Use a warm mist humidifier or inhale steam from a shower to increase air moisture. This may make it easier to breathe.  Drink enough fluid to keep  your pee (urine) clear or pale yellow.  Eat soups and other clear broths.  Have a healthy diet.  Rest as needed.  Go back to work when your fever is gone or your doctor says it is okay.  You may need to stay home longer to avoid giving your URI to others.  You can also wear a face mask and wash your hands often to prevent spread of the virus.  Use your inhaler more if you have asthma.  Do not use any tobacco products, including cigarettes, chewing tobacco, or  electronic cigarettes. If you need help quitting, ask your doctor. GET HELP IF:  You are getting worse, not better.  Your symptoms are not helped by medicine.  You have chills.  You are getting more short of breath.  You have brown or red mucus.  You have yellow or brown discharge from your nose.  You have pain in your face, especially when you bend forward.  You have a fever.  You have puffy (swollen) neck glands.  You have pain while swallowing.  You have white areas in the back of your throat. GET HELP RIGHT AWAY IF:   You have very bad or constant:  Headache.  Ear pain.  Pain in your forehead, behind your eyes, and over your cheekbones (sinus pain).  Chest pain.  You have long-lasting (chronic) lung disease and any of the following:  Wheezing.  Long-lasting cough.  Coughing up blood.  A change in your usual mucus.  You have a stiff neck.  You have changes in your:  Vision.  Hearing.  Thinking.  Mood. MAKE SURE YOU:   Understand these instructions.  Will watch your condition.  Will get help right away if you are not doing well or get worse.   This information is not intended to replace advice given to you by your health care provider. Make sure you discuss any questions you have with your health care provider.   Document Released: 01/26/2008 Document Revised: 12/24/2014 Document Reviewed: 11/14/2013 Elsevier Interactive Patient Education 2016 Elsevier Inc.  Bacterial Conjunctivitis Bacterial conjunctivitis (commonly called pink eye) is redness, soreness, or puffiness (inflammation) of the white part of your eye. It is caused by a germ called bacteria. These germs can easily spread from person to person (contagious). Your eye often will become red or pink. Your eye may also become irritated, watery, or have a thick discharge.  HOME CARE   Apply a cool, clean washcloth over closed eyelids. Do this for 10-20 minutes, 3-4 times a day while you  have pain.  Gently wipe away any fluid coming from the eye with a warm, wet washcloth or cotton ball.  Wash your hands often with soap and water. Use paper towels to dry your hands.  Do not share towels or washcloths.  Change or wash your pillowcase every day.  Do not use eye makeup until the infection is gone.  Do not use machines or drive if your vision is blurry.  Stop using contact lenses. Do not use them again until your doctor says it is okay.  Do not touch the tip of the eye drop bottle or medicine tube with your fingers when you put medicine on the eye. GET HELP RIGHT AWAY IF:   Your eye is not better after 3 days of starting your medicine.  You have a yellowish fluid coming out of the eye.  You have more pain in the eye.  Your eye redness is spreading.  Your  vision becomes blurry.  You have a fever or lasting symptoms for more than 2-3 days.  You have a fever and your symptoms suddenly get worse.  You have pain in the face.  Your face gets red or puffy (swollen). MAKE SURE YOU:   Understand these instructions.  Will watch this condition.  Will get help right away if you are not doing well or get worse.   This information is not intended to replace advice given to you by your health care provider. Make sure you discuss any questions you have with your health care provider.   Document Released: 05/18/2008 Document Revised: 07/26/2012 Document Reviewed: 04/14/2012 Elsevier Interactive Patient Education [employer].

## 2017-04-21 ENCOUNTER — Encounter: Payer: Self-pay | Admitting: Emergency Medicine

## 2017-04-21 ENCOUNTER — Emergency Department: Payer: BLUE CROSS/BLUE SHIELD

## 2017-04-21 DIAGNOSIS — Z79899 Other long term (current) drug therapy: Secondary | ICD-10-CM | POA: Insufficient documentation

## 2017-04-21 DIAGNOSIS — F17290 Nicotine dependence, other tobacco product, uncomplicated: Secondary | ICD-10-CM | POA: Insufficient documentation

## 2017-04-21 DIAGNOSIS — R079 Chest pain, unspecified: Secondary | ICD-10-CM | POA: Diagnosis present

## 2017-04-21 DIAGNOSIS — Z7984 Long term (current) use of oral hypoglycemic drugs: Secondary | ICD-10-CM | POA: Insufficient documentation

## 2017-04-21 LAB — CBC
HCT: 40.3 % (ref 40.0–52.0)
Hemoglobin: 13.9 g/dL (ref 13.0–18.0)
MCH: 28.3 pg (ref 26.0–34.0)
MCHC: 34.6 g/dL (ref 32.0–36.0)
MCV: 81.7 fL (ref 80.0–100.0)
Platelets: 232 10*3/uL (ref 150–440)
RBC: 4.93 MIL/uL (ref 4.40–5.90)
RDW: 13.4 % (ref 11.5–14.5)
WBC: 8.9 10*3/uL (ref 3.8–10.6)

## 2017-04-21 LAB — TROPONIN I: Troponin I: <0.03 ng/mL (ref <0.03)

## 2017-04-21 LAB — BASIC METABOLIC PANEL
Anion gap: 9 (ref 5–15)
BUN: 15 mg/dL (ref 6–20)
CALCIUM: 9 mg/dL (ref 8.9–10.3)
CO2: 26 mmol/L (ref 22–32)
Chloride: 104 mmol/L (ref 101–111)
Creatinine, Ser: 1.03 mg/dL (ref 0.61–1.24)
GFR calc Af Amer: >60 mL/min (ref >60)
Glucose, Bld: 190 mg/dL — ABNORMAL HIGH (ref 65–99)
Potassium: 3.4 mmol/L — ABNORMAL LOW (ref 3.5–5.1)
Sodium: 139 mmol/L (ref 135–145)

## 2017-04-21 NOTE — ED Triage Notes (Signed)
Pt comes into the ED via POV c/o left chest pain that radiates into his arm.  Patient denies any dizziness or N/V.  Patient has mild shortness of breath.  Patient alert and oriented x4 and ambulatory to triage at this time. Patient states pain started yesterday and it is described as a sharp pressure.

## 2017-04-22 ENCOUNTER — Emergency Department
Admission: EM | Admit: 2017-04-22 | Discharge: 2017-04-22 | Disposition: A | Discharge status: Home / Self Care | Payer: BLUE CROSS/BLUE SHIELD | Attending: Emergency Medicine | Admitting: Emergency Medicine

## 2017-04-22 DIAGNOSIS — R079 Chest pain, unspecified: Secondary | ICD-10-CM

## 2017-04-22 HISTORY — DX: Gastro-esophageal reflux disease without esophagitis: K21.9

## 2017-04-22 LAB — TROPONIN I: Troponin I: <0.03 ng/mL

## 2017-04-22 NOTE — ED Notes (Signed)
Pt states CP resolved

## 2017-04-22 NOTE — ED Notes (Signed)
Pt. Verbalizes understanding of d/c instructions and follow-up. VS stable and pain controlled per pt.  Pt. In NAD at time of d/c and denies further concerns regarding this visit. Pt. Stable at the time of departure from the unit, departing unit by the safest and most appropriate manner per that pt condition and limitations. Pt advised to return to the ED at any time for emergent concerns, or for new/worsening symptoms.   

## 2017-04-22 NOTE — ED Notes (Signed)
ED Provider at bedside. 

## 2017-04-22 NOTE — ED Provider Notes (Signed)
[facility] Emergency Department Provider Note   Time seen: 12:45 AM  I have reviewed the triage vital signs and the nursing notes.   HISTORY  Chief Complaint Chest Pain    HPI Luis TRICKETT is a 56 y.o. male presents emergency Department with history of achy/sharp left lower chest pain which occurred approximately 1 PM yesterday lasting a few seconds and spontaneously resolved. Patient denied any nausea vomiting dyspnea diaphoresis at the time. Patient denies any symptoms at present saying chest pain is resolved. Patient denies any lower external pain swelling. Patient denies any personal history of CAD pulmonary emboli or DVT    Past Medical History:  Diagnosis Date  . GERD (gastroesophageal reflux disease)     There are no active problems to display for this patient.   Past Surgical History:  Procedure Laterality Date  . EYE SURGERY    . HERNIA REPAIR      Prior to Admission medications   Medication Sig Start Date End Date Taking? Authorizing Provider  azithromycin (ZITHROMAX) 250 MG tablet Take 1 tablet (250 mg total) by mouth daily. 01/10/16   Paulette Blanch, MD  metFORMIN (GLUCOPHAGE) 500 MG tablet Take 500 mg by mouth 2 (two) times daily.    [provider]  pantoprazole (PROTONIX) 40 MG tablet Take 1 tablet (40 mg total) by mouth daily. 10/04/15 10/03/16  Paulette Blanch, MD  simvastatin (ZOCOR) 20 MG tablet Take 20 mg by mouth daily.    [provider]    Allergies No known drug allergies No family history on file.  Social History Social History  Substance Use Topics  . Smoking status: Light Tobacco Smoker    Types: Cigars  . Smokeless tobacco: Never Used  . Alcohol use Yes     Comment: rarely    Review of Systems Constitutional: No fever/chills Eyes: No visual changes. ENT: No sore throat. Cardiovascular: positive for chest pain. Respiratory: Denies shortness of breath. Gastrointestinal: No abdominal pain.   No nausea, no vomiting.  No diarrhea.  No constipation. Genitourinary: Negative for dysuria. Musculoskeletal: Negative for neck pain.  Negative for back pain. Integumentary: Negative for rash. Neurological: Negative for headaches, focal weakness or numbness.   ____________________________________________   PHYSICAL EXAM:  VITAL SIGNS: ED Triage Vitals [04/21/17 2139]  Enc Vitals Group     BP      Pulse      Resp      Temp      Temp src      SpO2      Weight 112 kg (247 lb)     Height 1.803 m (5\' 11" )     Head Circumference      Peak Flow      Pain Score 8     Pain Loc      Pain Edu?      Excl. in [place]?     Constitutional: Alert and oriented. Well appearing and in no acute distress. Eyes: Conjunctivae are normal.  Head: Atraumatic. Mouth/Throat: Mucous membranes are moist.  Oropharynx non-erythematous. Neck: No stridor.   Cardiovascular: Normal rate, regular rhythm. Good peripheral circulation. Grossly normal heart sounds. Respiratory: Normal respiratory effort.  No retractions. Lungs CTAB. Gastrointestinal: Soft and nontender. No distention.  Musculoskeletal: No lower extremity tenderness nor edema. No gross deformities of extremities. Neurologic:  Normal speech and language. No gross focal neurologic deficits are appreciated.  Skin:  Skin is warm, dry and intact. No rash noted. Psychiatric: Mood and  affect are normal. Speech and behavior are normal.  ____________________________________________   LABS (all labs ordered are listed, but only abnormal results are displayed)  Labs Reviewed  BASIC METABOLIC PANEL - Abnormal; Notable for the following:       Result Value   Potassium 3.4 (*)    Glucose, Bld 190 (*)    All other components within normal limits  CBC  TROPONIN I  TROPONIN I   ____________________________________________  EKG ED ECG REPORT I, [place] N Raygen Linquist, the attending physician, personally viewed and interpreted this ECG.   Date:  04/22/2017  EKG Time: 9:36 PM  Rate: 59  Rhythm: sinus bradycardia  Axis: normal  Intervals:normal  ST&T Change: none  ____________________________________________  RADIOLOGY I, [place] N Teasha Murrillo, personally viewed and evaluated these images (plain radiographs) as part of my medical decision making, as well as reviewing the written report by the radiologist.  Dg Chest 2 View  Result Date: 04/21/2017 CLINICAL DATA:  Left-sided chest pain EXAM: CHEST  2 VIEW COMPARISON:  01/10/2016 FINDINGS: The heart size and mediastinal contours are within normal limits. Coarse likely chronic interstitial opacity. No focal consolidation or effusion. No pneumothorax. IMPRESSION: No active cardiopulmonary disease. Electronically Signed   By: Donavan Foil M.D.   On: 04/21/2017 22:01      Procedures   ____________________________________________   INITIAL IMPRESSION / ASSESSMENT AND PLAN / ED COURSE  Pertinent labs & imaging results that were available during my care of the patient were reviewed by me and considered in my medical decision making (see chart for details).  42 -year-old male presenting with above stated history. EKG revealed no evidence of ischemia or infarction, troponin negative 2. Consider possibility of angina and a such patient will be referred to outpatient cardiology.      ____________________________________________  FINAL CLINICAL IMPRESSION(S) / ED DIAGNOSES  Final diagnoses:  Chest pain, unspecified type     MEDICATIONS GIVEN DURING THIS VISIT:  Medications - No data to display   NEW OUTPATIENT MEDICATIONS STARTED DURING THIS VISIT:  New Prescriptions   No medications on file    Modified Medications   No medications on file    Discontinued Medications   No medications on file     Note:  This document was prepared using Dragon voice recognition software and may include unintentional dictation errors.    Gregor Hams, MD 04/22/17 [phone]

## 2018-02-07 DIAGNOSIS — K760 Fatty (change of) liver, not elsewhere classified: Secondary | ICD-10-CM | POA: Insufficient documentation

## 2018-02-07 HISTORY — DX: Fatty (change of) liver, not elsewhere classified: K76.0

## 2018-06-27 ENCOUNTER — Other Ambulatory Visit: Payer: Self-pay

## 2018-06-27 ENCOUNTER — Emergency Department
Admission: EM | Admit: 2018-06-27 | Discharge: 2018-06-27 | Disposition: A | Discharge status: Home / Self Care | Payer: Self-pay | Attending: Emergency Medicine | Admitting: Emergency Medicine

## 2018-06-27 ENCOUNTER — Emergency Department: Payer: Self-pay

## 2018-06-27 DIAGNOSIS — F17200 Nicotine dependence, unspecified, uncomplicated: Secondary | ICD-10-CM | POA: Insufficient documentation

## 2018-06-27 DIAGNOSIS — K219 Gastro-esophageal reflux disease without esophagitis: Secondary | ICD-10-CM | POA: Insufficient documentation

## 2018-06-27 DIAGNOSIS — Z7984 Long term (current) use of oral hypoglycemic drugs: Secondary | ICD-10-CM | POA: Insufficient documentation

## 2018-06-27 DIAGNOSIS — R05 Cough: Secondary | ICD-10-CM | POA: Insufficient documentation

## 2018-06-27 DIAGNOSIS — R059 Cough, unspecified: Secondary | ICD-10-CM

## 2018-06-27 DIAGNOSIS — R0789 Other chest pain: Secondary | ICD-10-CM | POA: Insufficient documentation

## 2018-06-27 DIAGNOSIS — Z79899 Other long term (current) drug therapy: Secondary | ICD-10-CM | POA: Insufficient documentation

## 2018-06-27 LAB — CBC
HCT: 42.7 % (ref 39.0–52.0)
HEMOGLOBIN: 13.8 g/dL (ref 13.0–17.0)
MCH: 27.7 pg (ref 26.0–34.0)
MCHC: 32.3 g/dL (ref 30.0–36.0)
MCV: 85.6 fL (ref 80.0–100.0)
Platelets: 261 10*3/uL (ref 150–400)
RBC: 4.99 MIL/uL (ref 4.22–5.81)
RDW: 12.5 % (ref 11.5–15.5)
WBC: 11.5 10*3/uL — AB (ref 4.0–10.5)
nRBC: 0 % (ref 0.0–0.2)

## 2018-06-27 LAB — BASIC METABOLIC PANEL
ANION GAP: 8 (ref 5–15)
BUN: 15 mg/dL (ref 6–20)
CO2: 29 mmol/L (ref 22–32)
Calcium: 9.3 mg/dL (ref 8.9–10.3)
Chloride: 104 mmol/L (ref 98–111)
Creatinine, Ser: 1.07 mg/dL (ref 0.61–1.24)
GFR calc Af Amer: >60 mL/min (ref >60)
Glucose, Bld: 131 mg/dL — ABNORMAL HIGH (ref 70–99)
POTASSIUM: 3.7 mmol/L (ref 3.5–5.1)
SODIUM: 141 mmol/L (ref 135–145)

## 2018-06-27 LAB — TROPONIN I

## 2018-06-27 MED ORDER — BENZONATATE 100 MG PO CAPS: 100.0000 mg | ORAL_CAPSULE | Freq: Three times a day (TID) | ORAL | 0 refills | Status: DC | PRN

## 2018-06-27 MED ORDER — OMEPRAZOLE MAGNESIUM 20 MG PO TBEC: 20.0000 mg | DELAYED_RELEASE_TABLET | Freq: Every day | ORAL | 1 refills | Status: AC

## 2018-06-27 MED ORDER — ALUM & MAG HYDROXIDE-SIMETH 200-200-20 MG/5ML PO SUSP
30.0000 mL | Freq: Once | ORAL | Status: AC
  Administered 2018-06-27: 30 mL via ORAL
  Filled 2018-06-27: qty 30

## 2018-06-27 MED ORDER — LIDOCAINE VISCOUS HCL 2 % MT SOLN
15.0000 mL | Freq: Once | OROMUCOSAL | Status: AC
  Administered 2018-06-27: 15 mL via ORAL
  Filled 2018-06-27: qty 15

## 2018-06-27 NOTE — ED Triage Notes (Signed)
Pt in with co chest pain for few weeks states took rolaids and relieved pain. Denies any pain at this time, denies any heart disease.

## 2018-06-27 NOTE — ED Provider Notes (Signed)
[facility] Emergency Department Provider Note  ____________________________________________   First MD Initiated Contact with Patient 06/27/18 [phone]     (approximate)  I have reviewed the triage vital signs and the nursing notes.   HISTORY  Chief Complaint Chest Pain    HPI Luis Williams is a 57 y.o. male with medical history as listed below who presents for evaluation of several weeks of pain in his chest wall and upper abdomen.  He says that nothing particular makes it worse but taking Rolaids makes it better.  He has seen Dr. Clayborn Bigness in the past and had a work-up that was reassuring from a cardiac perspective.  He has no history of MI.  He has not been short of breath but he has had a cough for about 3 weeks that makes him have a sore throat and sometimes he gets a little bit choked when he is coughing up phlegm.  He denies fever/chills, nausea, vomiting, lower abdominal pain.  He describes the symptoms as anywhere from mild to severe at times.  Past Medical History:  Diagnosis Date  . GERD (gastroesophageal reflux disease)     There are no active problems to display for this patient.   Past Surgical History:  Procedure Laterality Date  . EYE SURGERY    . HERNIA REPAIR      Prior to Admission medications   Medication Sig Start Date End Date Taking? Authorizing Provider  azithromycin (ZITHROMAX) 250 MG tablet Take 1 tablet (250 mg total) by mouth daily. 01/10/16   Paulette Blanch, MD  benzonatate (TESSALON PERLES) 100 MG capsule Take 1 capsule (100 mg total) by mouth 3 (three) times daily as needed for cough. 06/27/18   Hinda Kehr, MD  metFORMIN (GLUCOPHAGE) 500 MG tablet Take 500 mg by mouth 2 (two) times daily.    [provider]  omeprazole (PRILOSEC OTC) 20 MG tablet Take 1 tablet (20 mg total) by mouth daily. 06/27/18 06/27/19  Hinda Kehr, MD  pantoprazole (PROTONIX) 40 MG tablet Take 1 tablet (40 mg total) by mouth daily. 10/04/15  10/03/16  Paulette Blanch, MD  simvastatin (ZOCOR) 20 MG tablet Take 20 mg by mouth daily.    [provider]    Allergies Patient has no known allergies.  No family history on file.  Social History Social History   Tobacco Use  . Smoking status: Light Tobacco Smoker    Types: Cigars  . Smokeless tobacco: Never Used  Substance Use Topics  . Alcohol use: Yes    Comment: rarely  . Drug use: No    Review of Systems Constitutional: No fever/chills Eyes: No visual changes. ENT: sore throat associated with cough Cardiovascular: Chest discomfort as described above Respiratory: Denies shortness of breath.  Cough for 3 weeks Gastrointestinal: No abdominal pain.  No nausea, no vomiting.  No diarrhea.  No constipation. Genitourinary: Negative for dysuria. Musculoskeletal: Negative for neck pain.  Negative for back pain. Integumentary: Negative for rash. Neurological: Negative for headaches, focal weakness or numbness.   ____________________________________________   PHYSICAL EXAM:  VITAL SIGNS: ED Triage Vitals  Enc Vitals Group     BP 06/27/18 0136 (!) 144/73     Pulse Rate 06/27/18 0136 (!) 53     Resp 06/27/18 0136 17     Temp 06/27/18 0136 97.7 F (36.5 C)     Temp Source 06/27/18 0136 Oral     SpO2 06/27/18 0136 97 %     Weight 06/27/18  0138 110.7 kg (244 lb)     Height 06/27/18 0138 1.803 m (5\' 11" )     Head Circumference --      Peak Flow --      Pain Score 06/27/18 0145 0     Pain Loc --      Pain Edu? --      Excl. in [place]? --     Constitutional: Alert and oriented. Well appearing and in no acute distress. Eyes: Conjunctivae are normal.  Head: Atraumatic. Nose: No congestion/rhinnorhea. Mouth/Throat: Mucous membranes are moist.  Oropharynx non-erythematous. Neck: No stridor.  No meningeal signs.   Cardiovascular: Normal rate, regular rhythm. Good peripheral circulation. Grossly normal heart sounds. Respiratory: Normal respiratory effort.  No  retractions. Lungs CTAB. Gastrointestinal: Soft and nontender. No distention.  Musculoskeletal: No lower extremity tenderness nor edema. No gross deformities of extremities. Neurologic:  Normal speech and language. No gross focal neurologic deficits are appreciated.  Skin:  Skin is warm, dry and intact. No rash noted. Psychiatric: Mood and affect are normal. Speech and behavior are normal.  ____________________________________________   LABS (all labs ordered are listed, but only abnormal results are displayed)  Labs Reviewed  BASIC METABOLIC PANEL - Abnormal; Notable for the following components:      Result Value   Glucose, Bld 131 (*)    All other components within normal limits  CBC - Abnormal; Notable for the following components:   WBC 11.5 (*)    All other components within normal limits  TROPONIN I   ____________________________________________  EKG  ED ECG REPORT I, Hinda Kehr, the attending physician, personally viewed and interpreted this ECG.  Date: 06/27/2018 EKG Time: 1:36 AM Rate: 53 Rhythm: Sinus bradycardia QRS Axis: normal Intervals: normal ST/T Wave abnormalities: normal Narrative Interpretation: no evidence of acute ischemia  ____________________________________________  RADIOLOGY I, Hinda Kehr, personally viewed and evaluated these images (plain radiographs) as part of my medical decision making, as well as reviewing the written report by the radiologist.  ED MD interpretation: No indication of acute infection or any other acute intrathoracic abnormality  Official radiology report(s): Dg Chest 2 View  Result Date: 06/27/2018 CLINICAL DATA:  Chest pain for a few weeks. EXAM: CHEST - 2 VIEW COMPARISON:  04/21/2017 FINDINGS: Normal heart size and pulmonary vascularity. No focal airspace disease or consolidation in the lungs. No blunting of costophrenic angles. No pneumothorax. Mediastinal contours appear intact. Degenerative changes in the spine.  IMPRESSION: No active cardiopulmonary disease. Electronically Signed   By: Lucienne Capers M.D.   On: 06/27/2018 02:17    ____________________________________________   PROCEDURES  Critical Care performed: No   Procedure(s) performed:   Procedures   ____________________________________________   INITIAL IMPRESSION / ASSESSMENT AND PLAN / ED COURSE  As part of my medical decision making, I reviewed the following data within the [place] notes reviewed and incorporated, Labs reviewed , EKG interpreted , Old chart reviewed and Radiograph reviewed     Differential diagnosis includes, but is not limited to, acid reflux, musculoskeletal pain secondary to frequent coughing, ACS, pneumonia, bronchitis, pneumothorax.  The patient has had symptoms for weeks and has a reassuring work-up with no indication of ischemia on the EKG, and normal chest x-ray, and lab work that is all within normal limits except for a very mild leukocytosis.  The signs and symptoms are by far the most consistent with GI associated discomfort (GERD).  He does not take anything except for Rolaids which she says  makes it feel better.  I have my usual and customary discussion with the patient about taking a PPI consistently which should help the symptoms over time.  He is also having a cough and sore throat but without any sign of active pneumonia and I do not feel he would benefit from prednisone nor steroids based on his clear lung sounds, lack of tachycardia, lack of fever, etc.  He has no reproducible chest wall tenderness to palpation at this time.  I am giving a dose of Mylanta with viscous lidocaine to help with his sore throat even though there is no sign of exudate or erythema and I will prescribe Tessalon to soothe both his throat and to help with his cough.  He can follow-up with Dr. Clayborn Bigness.  I gave my usual customary return precautions and he understands and agrees with the plan.      ____________________________________________  FINAL CLINICAL IMPRESSION(S) / ED DIAGNOSES  Final diagnoses:  Atypical chest pain  Gastroesophageal reflux disease, esophagitis presence not specified  Cough     MEDICATIONS GIVEN DURING THIS VISIT:  Medications  alum & mag hydroxide-simeth (MAALOX/MYLANTA) 200-200-20 MG/5ML suspension 30 mL (has no administration in time range)    And  lidocaine (XYLOCAINE) 2 % viscous mouth solution 15 mL (has no administration in time range)     ED Discharge Orders         Ordered    benzonatate (TESSALON PERLES) 100 MG capsule  3 times daily PRN     06/27/18 0459    omeprazole (PRILOSEC OTC) 20 MG tablet  Daily     06/27/18 0459           Note:  This document was prepared using Dragon voice recognition software and may include unintentional dictation errors.    Hinda Kehr, MD 06/27/18 [id number]

## 2018-06-27 NOTE — Discharge Instructions (Signed)
Your workup in the Emergency Department today was reassuring.  We did not find any specific abnormalities.  We recommend you drink plenty of fluids, take your regular medications and/or any new ones prescribed today, and follow up with the doctor(s) listed in these documents as recommended.  Please consider taking a daily medication such as Prilosec or a similar antacid - it should help you over time.  Return to the Emergency Department if you develop new or worsening symptoms that concern you.

## 2019-04-17 ENCOUNTER — Emergency Department
Admission: EM | Admit: 2019-04-17 | Discharge: 2019-04-18 | Disposition: A | Discharge status: Home / Self Care | Payer: HRSA Program | Attending: Emergency Medicine | Admitting: Emergency Medicine

## 2019-04-17 ENCOUNTER — Other Ambulatory Visit: Payer: Self-pay

## 2019-04-17 ENCOUNTER — Emergency Department: Payer: HRSA Program

## 2019-04-17 DIAGNOSIS — Z79899 Other long term (current) drug therapy: Secondary | ICD-10-CM | POA: Insufficient documentation

## 2019-04-17 DIAGNOSIS — R1084 Generalized abdominal pain: Secondary | ICD-10-CM | POA: Diagnosis not present

## 2019-04-17 DIAGNOSIS — R509 Fever, unspecified: Secondary | ICD-10-CM

## 2019-04-17 DIAGNOSIS — U071 COVID-19: Secondary | ICD-10-CM | POA: Diagnosis not present

## 2019-04-17 DIAGNOSIS — E119 Type 2 diabetes mellitus without complications: Secondary | ICD-10-CM | POA: Insufficient documentation

## 2019-04-17 DIAGNOSIS — Z720 Tobacco use: Secondary | ICD-10-CM | POA: Insufficient documentation

## 2019-04-17 LAB — CBC WITH DIFFERENTIAL/PLATELET
Abs Immature Granulocytes: 0.01 10*3/uL (ref 0.00–0.07)
Basophils Absolute: 0.1 10*3/uL (ref 0.0–0.1)
Basophils Relative: 1 %
Eosinophils Absolute: 0 10*3/uL (ref 0.0–0.5)
Eosinophils Relative: 0 %
HCT: 45.8 % (ref 39.0–52.0)
Hemoglobin: 14.8 g/dL (ref 13.0–17.0)
Immature Granulocytes: 0 %
Lymphocytes Relative: 30 %
Lymphs Abs: 1.8 10*3/uL (ref 0.7–4.0)
MCH: 27.4 pg (ref 26.0–34.0)
MCHC: 32.3 g/dL (ref 30.0–36.0)
MCV: 84.7 fL (ref 80.0–100.0)
Monocytes Absolute: 0.8 10*3/uL (ref 0.1–1.0)
Monocytes Relative: 13 %
Neutro Abs: 3.3 10*3/uL (ref 1.7–7.7)
Neutrophils Relative %: 56 %
Platelets: 233 10*3/uL (ref 150–400)
RBC: 5.41 MIL/uL (ref 4.22–5.81)
RDW: 12.7 % (ref 11.5–15.5)
WBC: 5.9 10*3/uL (ref 4.0–10.5)
nRBC: 0 % (ref 0.0–0.2)

## 2019-04-17 LAB — PROTIME-INR
INR: 1 (ref 0.8–1.2)
Prothrombin Time: 13.3 seconds (ref 11.4–15.2)

## 2019-04-17 LAB — APTT: aPTT: 28 seconds (ref 24–36)

## 2019-04-17 MED ORDER — VANCOMYCIN HCL 10 G IV SOLR
2000.0000 mg | Freq: Once | INTRAVENOUS | Status: AC
  Administered 2019-04-18: 2000 mg via INTRAVENOUS
  Filled 2019-04-17: qty 2000

## 2019-04-17 MED ORDER — VANCOMYCIN HCL IN DEXTROSE 1-5 GM/200ML-% IV SOLN: 1000.0000 mg | Freq: Once | INTRAVENOUS | Status: DC

## 2019-04-17 MED ORDER — ACETAMINOPHEN 325 MG PO TABS
650.0000 mg | ORAL_TABLET | Freq: Once | ORAL | Status: AC
  Administered 2019-04-17: 23:00:00 650 mg via ORAL
  Filled 2019-04-17: qty 2

## 2019-04-17 MED ORDER — SODIUM CHLORIDE 0.9 % IV BOLUS
1000.0000 mL | Freq: Once | INTRAVENOUS | Status: AC
  Administered 2019-04-18: 1000 mL via INTRAVENOUS

## 2019-04-17 MED ORDER — SODIUM CHLORIDE 0.9 % IV SOLN
2.0000 g | Freq: Once | INTRAVENOUS | Status: AC
  Administered 2019-04-17: 23:00:00 2 g via INTRAVENOUS
  Filled 2019-04-17: qty 2

## 2019-04-17 MED ORDER — SODIUM CHLORIDE 0.9 % IV BOLUS
1000.0000 mL | Freq: Once | INTRAVENOUS | Status: AC
  Administered 2019-04-17: 1000 mL via INTRAVENOUS

## 2019-04-17 MED ORDER — METRONIDAZOLE IN NACL 5-0.79 MG/ML-% IV SOLN
500.0000 mg | Freq: Once | INTRAVENOUS | Status: AC
  Administered 2019-04-18: 500 mg via INTRAVENOUS
  Filled 2019-04-17: qty 100

## 2019-04-17 NOTE — ED Triage Notes (Addendum)
Pt in with co fever since today and fever. States has had increased urination and  Generalized abd pain. Has had some diarrhea and vomiting a few a day ago but none today. Pt states he also lost taste on Saturday.

## 2019-04-17 NOTE — ED Provider Notes (Signed)
[name] Emergency Department Provider Note  ____________________________________________   First MD Initiated Contact with Patient 04/17/19 2238     (approximate)  I have reviewed the triage vital signs and the nursing notes.   HISTORY  Chief Complaint Fever and Abdominal Pain    HPI Luis Williams is a 58 y.o. male here with fever, generalized weakness.  The patient states his symptoms started several days ago.  He states he believes it was around 3 to 4 days ago.  Symptoms began as generalized fatigue, chills, and mild shortness of breath.  He also had some eye redness and loss of his sense of taste and smell.  He has since developed intermittent fevers that are only intermittently responding to Tylenol.  He states that after taking Tylenol or Advil, his fever then returned.  The day after developing the symptoms, he began to develop an aching, throbbing, lower suprapubic pain and pressure, with sensation that he needs to urinate more frequently.  He does have a history of UTIs.  Denies any flank pain.  Has had some mild nausea but no vomiting.  No diarrhea.  No known specific sick contacts.  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  No other complaints.        Past Medical History:  Diagnosis Date  . GERD (gastroesophageal reflux disease)     There are no active problems to display for this patient.   Past Surgical History:  Procedure Laterality Date  . EYE SURGERY    . HERNIA REPAIR      Prior to Admission medications   Medication Sig Start Date End Date Taking? Authorizing Provider  azithromycin (ZITHROMAX) 250 MG tablet Take 1 tablet (250 mg total) by mouth daily. 01/10/16   Paulette Blanch, MD  benzonatate (TESSALON PERLES) 100 MG capsule Take 1 capsule (100 mg total) by mouth 3  (three) times daily as needed for cough. 06/27/18   Hinda Kehr, MD  metFORMIN (GLUCOPHAGE) 500 MG tablet Take 500 mg by mouth 2 (two) times daily.    [provider]  omeprazole (PRILOSEC OTC) 20 MG tablet Take 1 tablet (20 mg total) by mouth daily. 06/27/18 06/27/19  Hinda Kehr, MD  pantoprazole (PROTONIX) 40 MG tablet Take 1 tablet (40 mg total) by mouth daily. 10/04/15 10/03/16  Paulette Blanch, MD  simvastatin (ZOCOR) 20 MG tablet Take 20 mg by mouth daily.    [provider]    Allergies Patient has no known allergies.  No family history on file.  Social History Social History   Tobacco Use  . Smoking status: Light Tobacco Smoker    Types: Cigars  . Smokeless tobacco: Never Used  Substance Use Topics  . Alcohol use: Yes    Comment: rarely  . Drug use: No    Review of Systems  Review of Systems  Constitutional: Positive for chills and fatigue. Negative for fever.  HENT: Positive for congestion. Negative for sore throat.   Eyes: Positive for redness.  Respiratory: Negative for shortness of breath.   Cardiovascular: Negative for chest pain.  Gastrointestinal: Positive for abdominal pain and nausea.  Genitourinary: Positive for dysuria and frequency. Negative for flank pain.  Musculoskeletal: Negative for neck pain.  Skin: Negative for rash and wound.  Allergic/Immunologic: Negative for immunocompromised  state.  Neurological: Positive for weakness. Negative for numbness.  Hematological: Does not bruise/bleed easily.  All other systems reviewed and are negative.    ____________________________________________  PHYSICAL EXAM:      VITAL SIGNS: ED Triage Vitals [04/17/19 2209]  Enc Vitals Group     BP (!) 139/91     Pulse Rate (!) 106     Resp 20     Temp (!) 102.8 F (39.3 C)     Temp Source Oral     SpO2 93 %     Weight 250 lb (113.4 kg)     Height 5\' 11"  (1.803 m)     Head Circumference      Peak Flow      Pain Score 0     Pain Loc       Pain Edu?      Excl. in [place]?      Physical Exam Vitals signs and nursing note reviewed.  Constitutional:      General: He is not in acute distress.    Appearance: He is well-developed.  HENT:     Head: Normocephalic and atraumatic.     Mouth/Throat:     Mouth: Mucous membranes are moist.  Eyes:     Conjunctiva/sclera: Conjunctivae normal.     Comments: Mild conjunctival injection  Neck:     Musculoskeletal: Neck supple.  Cardiovascular:     Rate and Rhythm: Regular rhythm. Tachycardia present.     Heart sounds: Normal heart sounds. No murmur. No friction rub.  Pulmonary:     Effort: Pulmonary effort is normal. Tachypnea present. No respiratory distress.     Breath sounds: Normal breath sounds. No wheezing or rales.  Abdominal:     General: There is no distension.     Palpations: Abdomen is soft.     Tenderness: There is abdominal tenderness in the suprapubic area.     Comments: Mild suprapubic tenderness, no rebound or guarding  Skin:    General: Skin is warm.     Capillary Refill: Capillary refill takes less than 2 seconds.  Neurological:     Mental Status: He is alert and oriented to person, place, and time.     Motor: No abnormal muscle tone.       ____________________________________________   LABS (all labs ordered are listed, but only abnormal results are displayed)  Labs Reviewed  COMPREHENSIVE METABOLIC PANEL - Abnormal; Notable for the following components:      Result Value   Glucose, Bld 127 (*)    Creatinine, Ser 1.44 (*)    GFR calc non Af Amer 52 (*)    All other components within normal limits  URINALYSIS, ROUTINE W REFLEX MICROSCOPIC - Abnormal; Notable for the following components:   Color, Urine YELLOW (*)    APPearance CLEAR (*)    Ketones, ur 5 (*)    Protein, ur 30 (*)    All other components within normal limits  CULTURE, BLOOD (ROUTINE X 2)  CULTURE, BLOOD (ROUTINE X 2)  URINE CULTURE  SARS CORONAVIRUS 2 (TAT 6-12 HRS)  LACTIC ACID,  PLASMA  CBC WITH DIFFERENTIAL/PLATELET  APTT  PROTIME-INR    ____________________________________________  EKG: NSR, VR 95. QRS 91, QTc 418. No acute St-t segment changes. ________________________________________  RADIOLOGY All imaging, including plain films, CT scans, and ultrasounds, independently reviewed by me, and interpretations confirmed via formal radiology reads.  ED MD interpretation:   CXR: Bibasilar atelectasis  Official radiology report(s): Dg Chest Port 1  View  Result Date: 04/17/2019 CLINICAL DATA:  Sepsis, fever EXAM: PORTABLE CHEST 1 VIEW COMPARISON:  06/27/2018 FINDINGS: Heart is normal size. Mildly tortuous aorta. Bibasilar atelectasis. No effusions. No acute bony abnormality. IMPRESSION: Bibasilar atelectasis. Electronically Signed   By: Rolm Baptise M.D.   On: 04/17/2019 22:57    ____________________________________________  PROCEDURES   Procedure(s) performed (including Critical Care):  .Critical Care Performed by: Duffy Bruce, MD Authorized by: Duffy Bruce, MD   Critical care provider statement:    Critical care time (minutes):  35   Critical care time was exclusive of:  Separately billable procedures and treating other patients and teaching time   Critical care was necessary to treat or prevent imminent or life-threatening deterioration of the following conditions:  Circulatory failure, cardiac failure and sepsis   Critical care was time spent personally by me on the following activities:  Development of treatment plan with patient or surrogate, discussions with consultants, evaluation of patient's response to treatment, examination of patient, obtaining history from patient or surrogate, ordering and performing treatments and interventions, ordering and review of laboratory studies, ordering and review of radiographic studies, pulse oximetry, re-evaluation of patient's condition and review of old charts   I assumed direction of critical care for  this patient from another provider in my specialty: no      ____________________________________________  INITIAL IMPRESSION / MDM / [place] / ED COURSE  As part of my medical decision making, I reviewed the following data within the electronic MEDICAL RECORD NUMBER Notes from prior ED visits and [place] Controlled Substance Database      *Luis Williams was evaluated in Emergency Department on 04/18/2019 for the symptoms described in the history of present illness. He was evaluated in the context of the global COVID-19 pandemic, which necessitated consideration that the patient might be at risk for infection with the SARS-CoV-2 virus that causes COVID-19. Institutional protocols and algorithms that pertain to the evaluation of patients at risk for COVID-19 are in a state of rapid change based on information released by regulatory bodies including the CDC and federal and state organizations. These policies and algorithms were followed during the patient's care in the ED.  Some ED evaluations and interventions may be delayed as a result of limited staffing during the pandemic.*      Medical Decision Making: 58 yo M here with cough, SOB, dysuria. Pt febrile, tachycardic on arrival. CXR is concerning for bibasilar atelectasis, possible PNA. Pt started on sepsis protocol with broad-spectrum ABX. 2L NS ordered, will be cautious with fluids given obesity, resp distress, possibility of COVID. DDx incldues sepsis 2/2 PNA, COVID-19, UTI. His abdomen is soft but if no apparent alternative source of infection identified, would consider additional imaging. Plan to admit for management. COVID ordered.  At time of signout, lab work is pending.  While suspect patient may very well have UTI, also consideration of COVID, if lab work is unrevealing of potential source, could consider CT abdomen pelvis as this would both show lung bases as well as intra-abdominal pathology.  ____________________________________________  FINAL CLINICAL IMPRESSION(S) / ED DIAGNOSES  Final diagnoses:  Sepsis, due to unspecified organism, unspecified whether acute organ dysfunction present [facility])     MEDICATIONS GIVEN DURING THIS VISIT:  Medications  vancomycin (VANCOCIN) 2,000 mg in sodium chloride 0.9 % 500 mL IVPB (2,000 mg Intravenous New Bag/Given 04/18/19 0005)  ceFEPIme (MAXIPIME) 2 g in sodium chloride 0.9 % 100 mL IVPB (2 g Intravenous New Bag/Given 04/17/19  2318)  metroNIDAZOLE (FLAGYL) IVPB 500 mg (500 mg Intravenous New Bag/Given 04/18/19 0002)  acetaminophen (TYLENOL) tablet 650 mg (650 mg Oral Given 04/17/19 2257)  sodium chloride 0.9 % bolus 1,000 mL (1,000 mLs Intravenous New Bag/Given 04/17/19 2317)  sodium chloride 0.9 % bolus 1,000 mL (1,000 mLs Intravenous New Bag/Given 04/18/19 0001)     ED Discharge Orders    None       Note:  This document was prepared using Dragon voice recognition software and may include unintentional dictation errors.   Duffy Bruce, MD 04/18/19 [place]

## 2019-04-17 NOTE — Progress Notes (Signed)
PHARMACY -  BRIEF ANTIBIOTIC NOTE   Pharmacy has received consult(s) for vanc/cefepime from an ED provider.  The patient's profile has been reviewed for ht/wt/allergies/indication/available labs.    One time order(s) placed for vanc 2g IV load and cefepime 2g IV x 1  Further antibiotics/pharmacy consults should be ordered by admitting physician if indicated.                       Thank you,  Tobie Lords, PharmD, BCPS Clinical Pharmacist 04/17/2019  10:48 PM

## 2019-04-18 ENCOUNTER — Emergency Department: Payer: HRSA Program

## 2019-04-18 ENCOUNTER — Ambulatory Visit: Payer: Self-pay

## 2019-04-18 LAB — COMPREHENSIVE METABOLIC PANEL
ALT: 21 U/L (ref 0–44)
AST: 24 U/L (ref 15–41)
Albumin: 4.6 g/dL (ref 3.5–5.0)
Alkaline Phosphatase: 55 U/L (ref 38–126)
Anion gap: 9 (ref 5–15)
BUN: 16 mg/dL (ref 6–20)
CO2: 25 mmol/L (ref 22–32)
Calcium: 9.1 mg/dL (ref 8.9–10.3)
Chloride: 102 mmol/L (ref 98–111)
Creatinine, Ser: 1.44 mg/dL — ABNORMAL HIGH (ref 0.61–1.24)
GFR calc Af Amer: >60 mL/min (ref >60)
GFR calc non Af Amer: 52 mL/min — ABNORMAL LOW (ref >60)
Glucose, Bld: 127 mg/dL — ABNORMAL HIGH (ref 70–99)
Potassium: 3.8 mmol/L (ref 3.5–5.1)
Sodium: 136 mmol/L (ref 135–145)
Total Bilirubin: 0.5 mg/dL (ref 0.3–1.2)
Total Protein: 8.1 g/dL (ref 6.5–8.1)

## 2019-04-18 LAB — URINALYSIS, ROUTINE W REFLEX MICROSCOPIC
Bacteria, UA: NONE SEEN
Bilirubin Urine: NEGATIVE
Glucose, UA: NEGATIVE mg/dL
Hgb urine dipstick: NEGATIVE
Ketones, ur: 5 mg/dL — AB
Leukocytes,Ua: NEGATIVE
Nitrite: NEGATIVE
Protein, ur: 30 mg/dL — AB
Specific Gravity, Urine: 1.024 (ref 1.005–1.030)
Squamous Epithelial / HPF: NONE SEEN (ref 0–5)
pH: 5 (ref 5.0–8.0)

## 2019-04-18 LAB — SARS CORONAVIRUS 2 (TAT 6-24 HRS): SARS Coronavirus 2: POSITIVE — AB

## 2019-04-18 LAB — LACTIC ACID, PLASMA: Lactic Acid, Venous: 0.7 mmol/L (ref 0.5–1.9)

## 2019-04-18 MED ORDER — ONDANSETRON 4 MG PO TBDP: 4.0000 mg | ORAL_TABLET | Freq: Three times a day (TID) | ORAL | 0 refills | Status: AC | PRN

## 2019-04-18 MED ORDER — IOHEXOL 300 MG/ML  SOLN
100.0000 mL | Freq: Once | INTRAMUSCULAR | Status: AC | PRN
  Administered 2019-04-18: 04:00:00 100 mL via INTRAVENOUS

## 2019-04-18 NOTE — ED Notes (Signed)
Patient transported to CT 

## 2019-04-18 NOTE — ED Provider Notes (Signed)
Procedures     ----------------------------------------- 5:51 AM on 04/18/2019 -----------------------------------------  Work-up entirely unremarkable and reassuring.  Lactate normal, labs without focal abnormalities.  CT abdomen pelvis obtained which is also negative for any acute pathology.  Patient is feeling better, tachycardia resolved, fever controlled.  Not in any respiratory distress.  Oxygen saturation is 96% on room air.  I discussed with the patient all the results, offered admission but he feels well and feels like he can manage his symptoms and monitor his condition at home.  Strict return precautions for any worsening of his condition, severe pain, vomiting or intractable fever.  COVID swab sent which I think is the most likely diagnosis at this time.  He will quarantine at home.  Doubt PE or pneumonia.Carrie Mew, MD 04/18/19 [phone]

## 2019-04-18 NOTE — Progress Notes (Signed)
CODE SEPSIS - PHARMACY COMMUNICATION  **Broad Spectrum Antibiotics should be administered within 1 hour of Sepsis diagnosis**  Time Code Sepsis Called/Page Received: 2246  Antibiotics Ordered: vanc/cefepime/flagyl  Time of 1st antibiotic administration: 2318  Additional action taken by pharmacy:   If necessary, Name of Provider/Nurse Contacted:     Tobie Lords ,PharmD Clinical Pharmacist  04/18/2019  2:57 AM

## 2019-04-18 NOTE — Telephone Encounter (Signed)
Provided Covid -19  Lab results to Patient .  Voiced understanding.  Provided covid-19 card advice.  Pt. Voiced understanding.

## 2019-04-19 ENCOUNTER — Telehealth: Payer: Self-pay | Admitting: Emergency Medicine

## 2019-04-19 LAB — URINE CULTURE: Culture: NO GROWTH

## 2019-04-19 NOTE — Telephone Encounter (Signed)
Called to assure that patient is aware of positive covid 19 result.  Left message.

## 2019-04-22 LAB — CULTURE, BLOOD (ROUTINE X 2)
Culture: NO GROWTH
Culture: NO GROWTH
Special Requests: ADEQUATE
Special Requests: ADEQUATE

## 2019-09-10 ENCOUNTER — Emergency Department
Admission: EM | Admit: 2019-09-10 | Discharge: 2019-09-11 | Disposition: A | Discharge status: Home / Self Care | Payer: Self-pay | Attending: Emergency Medicine | Admitting: Emergency Medicine

## 2019-09-10 ENCOUNTER — Other Ambulatory Visit: Payer: Self-pay

## 2019-09-10 DIAGNOSIS — Z5321 Procedure and treatment not carried out due to patient leaving prior to being seen by health care provider: Secondary | ICD-10-CM | POA: Insufficient documentation

## 2019-09-10 DIAGNOSIS — K5900 Constipation, unspecified: Secondary | ICD-10-CM | POA: Insufficient documentation

## 2019-09-10 DIAGNOSIS — R109 Unspecified abdominal pain: Secondary | ICD-10-CM | POA: Insufficient documentation

## 2019-09-10 DIAGNOSIS — R630 Anorexia: Secondary | ICD-10-CM | POA: Insufficient documentation

## 2019-09-10 LAB — COMPREHENSIVE METABOLIC PANEL
ALT: 14 U/L (ref 0–44)
AST: 16 U/L (ref 15–41)
Albumin: 4.4 g/dL (ref 3.5–5.0)
Alkaline Phosphatase: 51 U/L (ref 38–126)
Anion gap: 7 (ref 5–15)
BUN: 14 mg/dL (ref 6–20)
CO2: 29 mmol/L (ref 22–32)
Calcium: 9.3 mg/dL (ref 8.9–10.3)
Chloride: 103 mmol/L (ref 98–111)
Creatinine, Ser: 1.19 mg/dL (ref 0.61–1.24)
GFR calc Af Amer: >60 mL/min (ref >60)
GFR calc non Af Amer: >60 mL/min (ref >60)
Glucose, Bld: 139 mg/dL — ABNORMAL HIGH (ref 70–99)
Potassium: 3.8 mmol/L (ref 3.5–5.1)
Sodium: 139 mmol/L (ref 135–145)
Total Bilirubin: 0.5 mg/dL (ref 0.3–1.2)
Total Protein: 8.2 g/dL — ABNORMAL HIGH (ref 6.5–8.1)

## 2019-09-10 LAB — CBC
HCT: 43.7 % (ref 39.0–52.0)
Hemoglobin: 14.4 g/dL (ref 13.0–17.0)
MCH: 27.4 pg (ref 26.0–34.0)
MCHC: 33 g/dL (ref 30.0–36.0)
MCV: 83.2 fL (ref 80.0–100.0)
Platelets: 292 10*3/uL (ref 150–400)
RBC: 5.25 MIL/uL (ref 4.22–5.81)
RDW: 12.3 % (ref 11.5–15.5)
WBC: 10.5 10*3/uL (ref 4.0–10.5)
nRBC: 0 % (ref 0.0–0.2)

## 2019-09-10 LAB — LIPASE, BLOOD: Lipase: 37 U/L (ref 11–51)

## 2019-09-10 MED ORDER — SODIUM CHLORIDE 0.9% FLUSH: 3.0000 mL | Freq: Once | INTRAVENOUS | Status: DC

## 2019-09-10 NOTE — ED Triage Notes (Signed)
Pt to the er for abd pain. Pt states he ate beans and chicken on Tuesday and thought he had food poisoning. Pt went to [place] and d/c. Pt says since then he has had decreased appetite, constipation. Pt had a ct scan with contrast.

## 2019-09-11 LAB — TROPONIN I (HIGH SENSITIVITY): Troponin I (High Sensitivity): 6 ng/L (ref <18)

## 2019-09-11 NOTE — ED Notes (Signed)
Pt reports leaving now due to long wait and will f/u with PCP or return if needed

## 2019-11-05 ENCOUNTER — Ambulatory Visit (INDEPENDENT_AMBULATORY_CARE_PROVIDER_SITE_OTHER): Payer: Managed Care, Other (non HMO) | Admitting: Gastroenterology

## 2019-11-05 ENCOUNTER — Encounter: Payer: Self-pay | Admitting: Gastroenterology

## 2019-11-05 ENCOUNTER — Other Ambulatory Visit: Payer: Self-pay

## 2019-11-05 VITALS — BP 146/90 | HR 85 | Temp 98.3°F | Ht 71.0 in | Wt 250.0 lb

## 2019-11-05 DIAGNOSIS — R194 Change in bowel habit: Secondary | ICD-10-CM

## 2019-11-05 DIAGNOSIS — R933 Abnormal findings on diagnostic imaging of other parts of digestive tract: Secondary | ICD-10-CM

## 2019-11-05 DIAGNOSIS — F1990 Other psychoactive substance use, unspecified, uncomplicated: Secondary | ICD-10-CM | POA: Diagnosis not present

## 2019-11-05 NOTE — Patient Instructions (Signed)

## 2019-11-05 NOTE — Progress Notes (Signed)
Luis Bellows MD, MRCP(U.K) [address]  [place]  [place], [place] [address]  Main: [phone]  Fax: [phone]   Gastroenterology Consultation  Referring Provider:     [facility] Primary Care Physician:  [facility] Primary Gastroenterologist:  Dr. Jonathon Williams  Reason for Consultation:     Weight loss and abdominal pain        HPI:   Luis Williams is a 59 y.o. y/o male referred for consultation & management  by . Luis Williams, [facility].    He has presented to the emergency room on 2 occasions within the past 2 months with abdominal pain.  On the first occasion on January 15.  Presented to [facility] with abdominal pain underwent a CT scan of the abdomen which showed increased stranding along the proximal duodenum and pancreatic head compared to 09/07/2019 may reflect pancreatitis versus duodenitis.  A day prior had presented to the emergency room and had similar findings.  LFTs were normal.  Lipase was normal.  Hemoglobin of 14.7 g.  Was supposed to follow-up with GI after that.  Presented on 10/15/2019 to [facility] emergency room.  He states that he has been having abdominal distention and bloating ongoing for 6 months but constipation.  The discomfort got to a point when it was very severe and he had to go to the emergency room.  He states that since then he started on a high-fiber diet including Metamucil which helps resolve all the problems.  Has 2 bowel movements a day and no abdominal pain or distention at this point of time.  He also recalls at the time of visit to the emergency room he had been taking a large quantity of BC powder and ibuprofen.  Occasionally, fumes alcohol.  Usually weekends.  Last colonoscopy was 30 years back.  No family history of colon cancer or polyps.  Denies any weight loss.  Past Medical History:  Diagnosis Date  . GERD (gastroesophageal reflux disease)     Past Surgical History:  Procedure Laterality Date  . EYE  SURGERY    . HERNIA REPAIR      Prior to Admission medications   Medication Sig Start Date End Date Taking? Authorizing Provider  metFORMIN (GLUCOPHAGE) 500 MG tablet Take 500 mg by mouth 2 (two) times daily.    [provider]  omeprazole (PRILOSEC OTC) 20 MG tablet Take 1 tablet (20 mg total) by mouth daily. 06/27/18 06/27/19  Hinda Kehr, MD  ondansetron (ZOFRAN ODT) 4 MG disintegrating tablet Take 1 tablet (4 mg total) by mouth every 8 (eight) hours as needed for nausea or vomiting. 04/18/19   Luis Mew, MD  simvastatin (ZOCOR) 20 MG tablet Take 20 mg by mouth daily.    [provider]    No family history on file.   Social History   Tobacco Use  . Smoking status: Light Tobacco Smoker    Types: Cigars  . Smokeless tobacco: Never Used  Substance Use Topics  . Alcohol use: Yes    Comment: rarely  . Drug use: No    Allergies as of 11/05/2019  . (No Known Allergies)    Review of Systems:    All systems reviewed and negative except where noted in HPI.   Physical Exam:  BP (!) 146/90   Pulse 85   Temp 98.3 F (36.8 C)   Ht 5\' 11"  (1.803 m)   Wt 250 lb (113.4 kg)   BMI 34.87  kg/m  No LMP for male patient. Psych:  Alert and cooperative. Normal mood and affect. General:   Alert,  Well-developed, well-nourished, pleasant and cooperative in NAD Head:  Normocephalic and atraumatic. Eyes:  Sclera clear, no icterus.   Conjunctiva pink. Ears:  Normal auditory acuity. Lungs:  Respirations even and unlabored.  Clear throughout to auscultation.   No wheezes, crackles, or rhonchi. No acute distress. Heart:  Regular rate and rhythm; no murmurs, clicks, rubs, or gallops. Abdomen:  Normal bowel sounds.  No bruits.  Soft, non-tender and non-distended without masses, hepatosplenomegaly or hernias noted.  No guarding or rebound tenderness.    Neurologic:  Alert and oriented x3;  grossly normal neurologically. Psych:  Alert and cooperative. Normal mood and  affect.  Imaging Studies: No results found.  Assessment and Plan:   Luis Williams is a 59 y.o. y/o male has been referred for abdominal pain ongoing for 6 months.  History of constipation that has resolved with increasing the fiber content in his diet.  History of NSAID use preceding his emergency room visit and there was concern for pancreatitis versus duodenitis on the CT scan.  His lipase and LFTs are completely normal making pancreatitis less likely.  More likely NSAID related ulceration of his duodenum is possible.  All his symptoms are resolved presently.  Due to a change in bowel habits and abnormal appearance of his CT scan I would suggest we proceed with EGD and colonoscopy.  In addition I have suggested increasing the fiber in his diet and will provide him high-fiber diet information.  In addition have suggested him to stop all NSAID use in the future on a regular basis and if having headaches occasionally to take Tylenol.  I will also check him for H. pylori with a breath test.   I have discussed alternative options, risks & benefits,  which include, but are not limited to, bleeding, infection, perforation,respiratory complication & drug reaction.  The patient agrees with this plan & written consent will be obtained.     Follow up in next 8 weeks  Dr Luis Bellows MD,MRCP(U.K)

## 2019-11-05 NOTE — Addendum Note (Signed)
Addended by: Dorethea Clan on: 11/05/2019 01:34 PM   Modules accepted: Orders

## 2019-11-06 LAB — H. PYLORI BREATH TEST: H pylori Breath Test: POSITIVE — AB

## 2019-11-07 ENCOUNTER — Other Ambulatory Visit: Payer: Self-pay

## 2019-11-07 ENCOUNTER — Encounter: Payer: Self-pay | Admitting: Gastroenterology

## 2019-11-07 ENCOUNTER — Telehealth: Payer: Self-pay

## 2019-11-07 DIAGNOSIS — A048 Other specified bacterial intestinal infections: Secondary | ICD-10-CM

## 2019-11-07 NOTE — Telephone Encounter (Signed)
-----   Message from Jonathon Bellows, MD sent at 11/07/2019  8:41 AM EDT ----- H pylori positive  Suggest clarithromycin 500 mg PO BID, amoxicillin 1 gram TID, omeprazole 20 mg BID all for 14 days.  , check for penicillin allergy and will need repeat H pylori stool antigen to check for eradication after .

## 2019-11-07 NOTE — Telephone Encounter (Signed)
Spoke with pt and informed him of lab result and Dr. Georgeann Oppenheim directions for treatment. Pt understands and agrees. I explained to pt that I will forward this information to his primary care provider, pt agrees.

## 2019-11-08 MED ORDER — CLARITHROMYCIN 500 MG PO TABS: 500.0000 mg | ORAL_TABLET | Freq: Two times a day (BID) | ORAL | 0 refills | Status: AC

## 2019-11-08 MED ORDER — AMOXICILLIN 500 MG PO TABS: 1000.0000 mg | ORAL_TABLET | Freq: Three times a day (TID) | ORAL | 0 refills | Status: AC

## 2021-09-08 ENCOUNTER — Other Ambulatory Visit: Payer: Self-pay | Admitting: Gastroenterology

## 2021-09-08 DIAGNOSIS — R634 Abnormal weight loss: Secondary | ICD-10-CM

## 2021-09-08 DIAGNOSIS — R1084 Generalized abdominal pain: Secondary | ICD-10-CM

## 2021-09-08 DIAGNOSIS — K8689 Other specified diseases of pancreas: Secondary | ICD-10-CM

## 2021-09-09 ENCOUNTER — Ambulatory Visit: Payer: Managed Care, Other (non HMO)

## 2021-09-10 ENCOUNTER — Other Ambulatory Visit: Payer: Managed Care, Other (non HMO)

## 2021-09-10 ENCOUNTER — Ambulatory Visit
Admission: RE | Admit: 2021-09-10 | Discharge: 2021-09-10 | Disposition: A | Discharge status: Home / Self Care | Payer: Managed Care, Other (non HMO) | Source: Ambulatory Visit | Attending: Gastroenterology | Admitting: Gastroenterology

## 2021-09-10 ENCOUNTER — Other Ambulatory Visit: Payer: Self-pay

## 2021-09-10 DIAGNOSIS — K8689 Other specified diseases of pancreas: Secondary | ICD-10-CM

## 2021-09-10 DIAGNOSIS — R634 Abnormal weight loss: Secondary | ICD-10-CM

## 2021-09-10 DIAGNOSIS — R1084 Generalized abdominal pain: Secondary | ICD-10-CM

## 2021-09-10 MED ORDER — GADOBENATE DIMEGLUMINE 529 MG/ML IV SOLN
20.0000 mL | Freq: Once | INTRAVENOUS | Status: AC | PRN
  Administered 2021-09-10: 20 mL via INTRAVENOUS

## 2021-12-07 ENCOUNTER — Encounter: Payer: Self-pay | Admitting: *Deleted

## 2021-12-08 ENCOUNTER — Ambulatory Visit: Payer: 59 | Admitting: Certified Registered"

## 2021-12-08 ENCOUNTER — Encounter
Admission: RE | Disposition: A | Discharge status: Home / Self Care | Payer: Self-pay | Source: Home / Self Care | Attending: Gastroenterology

## 2021-12-08 ENCOUNTER — Ambulatory Visit
Admission: RE | Admit: 2021-12-08 | Discharge: 2021-12-08 | Disposition: A | Discharge status: Home / Self Care | Payer: 59 | Attending: Gastroenterology | Admitting: Gastroenterology

## 2021-12-08 ENCOUNTER — Encounter: Payer: Self-pay | Admitting: *Deleted

## 2021-12-08 DIAGNOSIS — E785 Hyperlipidemia, unspecified: Secondary | ICD-10-CM | POA: Insufficient documentation

## 2021-12-08 DIAGNOSIS — R194 Change in bowel habit: Secondary | ICD-10-CM | POA: Insufficient documentation

## 2021-12-08 DIAGNOSIS — D120 Benign neoplasm of cecum: Secondary | ICD-10-CM | POA: Diagnosis not present

## 2021-12-08 DIAGNOSIS — D123 Benign neoplasm of transverse colon: Secondary | ICD-10-CM | POA: Insufficient documentation

## 2021-12-08 DIAGNOSIS — I10 Essential (primary) hypertension: Secondary | ICD-10-CM | POA: Insufficient documentation

## 2021-12-08 DIAGNOSIS — R1013 Epigastric pain: Secondary | ICD-10-CM | POA: Insufficient documentation

## 2021-12-08 DIAGNOSIS — E119 Type 2 diabetes mellitus without complications: Secondary | ICD-10-CM | POA: Diagnosis not present

## 2021-12-08 DIAGNOSIS — Z87891 Personal history of nicotine dependence: Secondary | ICD-10-CM | POA: Diagnosis not present

## 2021-12-08 DIAGNOSIS — B9681 Helicobacter pylori [H. pylori] as the cause of diseases classified elsewhere: Secondary | ICD-10-CM | POA: Insufficient documentation

## 2021-12-08 DIAGNOSIS — Z7984 Long term (current) use of oral hypoglycemic drugs: Secondary | ICD-10-CM | POA: Diagnosis not present

## 2021-12-08 DIAGNOSIS — K219 Gastro-esophageal reflux disease without esophagitis: Secondary | ICD-10-CM | POA: Diagnosis not present

## 2021-12-08 DIAGNOSIS — R1084 Generalized abdominal pain: Secondary | ICD-10-CM | POA: Diagnosis present

## 2021-12-08 DIAGNOSIS — K640 First degree hemorrhoids: Secondary | ICD-10-CM | POA: Insufficient documentation

## 2021-12-08 DIAGNOSIS — K2950 Unspecified chronic gastritis without bleeding: Secondary | ICD-10-CM | POA: Diagnosis not present

## 2021-12-08 HISTORY — DX: Allergic rhinitis, unspecified: J30.9

## 2021-12-08 HISTORY — PX: COLONOSCOPY WITH PROPOFOL: SHX5780

## 2021-12-08 HISTORY — PX: ESOPHAGOGASTRODUODENOSCOPY (EGD) WITH PROPOFOL: SHX5813

## 2021-12-08 LAB — GLUCOSE, CAPILLARY: Glucose-Capillary: 171 mg/dL — ABNORMAL HIGH (ref 70–99)

## 2021-12-08 SURGERY — COLONOSCOPY WITH PROPOFOL
Anesthesia: Monitor Anesthesia Care

## 2021-12-08 MED ORDER — PROPOFOL 10 MG/ML IV BOLUS
INTRAVENOUS | Status: DC | PRN
  Administered 2021-12-08: 30 mg via INTRAVENOUS
  Administered 2021-12-08: 100 mg via INTRAVENOUS
  Administered 2021-12-08 (×2): 30 mg via INTRAVENOUS
  Administered 2021-12-08: 50 mg via INTRAVENOUS
  Administered 2021-12-08: 40 mg via INTRAVENOUS
  Administered 2021-12-08: 30 mg via INTRAVENOUS

## 2021-12-08 MED ORDER — LIDOCAINE HCL (CARDIAC) PF 100 MG/5ML IV SOSY
PREFILLED_SYRINGE | INTRAVENOUS | Status: DC | PRN
Start: 2021-12-08 — End: 2021-12-08
  Administered 2021-12-08: 100 mg via INTRAVENOUS

## 2021-12-08 MED ORDER — SODIUM CHLORIDE 0.9 % IV SOLN
INTRAVENOUS | Status: DC
  Administered 2021-12-08: 1000 mL via INTRAVENOUS

## 2021-12-08 NOTE — Op Note (Signed)
[facility] ?Gastroenterology ?Patient Name: Luis Williams ?Procedure Date: 12/08/2021 8:44 AM ?MRN: [id number] ?Account #: [id number] ?Date of Birth: 06/12/1961 ?Admit Type: Outpatient ?Age: 61 ?Room: [facility] ENDO ROOM 1 ?Gender: Male ?Note Status: Finalized ?Instrument Name: Upper Endoscope [id number] ?Procedure:             Upper GI endoscopy ?Indications:           Generalized abdominal pain, Dyspepsia ?Providers:             Andrey Farmer MD, MD ?Medicines:             Monitored Anesthesia Care ?Complications:         No immediate complications. Estimated blood loss:  ?                       Minimal. ?Procedure:             Pre-Anesthesia Assessment: ?                       - Prior to the procedure, a History and Physical was  ?                       performed, and patient medications and allergies were  ?                       reviewed. The patient is competent. The risks and  ?                       benefits of the procedure and the sedation options and  ?                       risks were discussed with the patient. All questions  ?                       were answered and informed consent was obtained.  ?                       Patient identification and proposed procedure were  ?                       verified by the physician, the nurse, the  ?                       anesthesiologist, the anesthetist and the technician  ?                       in the endoscopy suite. Mental Status Examination:  ?                       alert and oriented. Airway Examination: normal  ?                       oropharyngeal airway and neck mobility. Respiratory  ?                       Examination: clear to auscultation. CV Examination:  ?                       normal. Prophylactic Antibiotics: The patient does not  ?  require prophylactic antibiotics. Prior  ?                       Anticoagulants: The patient has taken no previous  ?                       anticoagulant or antiplatelet agents.  ASA Grade  ?                       Assessment: II - A patient with mild systemic disease.  ?                       After reviewing the risks and benefits, the patient  ?                       was deemed in satisfactory condition to undergo the  ?                       procedure. The anesthesia plan was to use monitored  ?                       anesthesia care (MAC). Immediately prior to  ?                       administration of medications, the patient was  ?                       re-assessed for adequacy to receive sedatives. The  ?                       heart rate, respiratory rate, oxygen saturations,  ?                       blood pressure, adequacy of pulmonary ventilation, and  ?                       response to care were monitored throughout the  ?                       procedure. The physical status of the patient was  ?                       re-assessed after the procedure. ?                       After obtaining informed consent, the endoscope was  ?                       passed under direct vision. Throughout the procedure,  ?                       the patient's blood pressure, pulse, and oxygen  ?                       saturations were monitored continuously. The Endoscope  ?                       was introduced through the mouth, and advanced to the  ?  second part of duodenum. The upper GI endoscopy was  ?                       accomplished without difficulty. The patient tolerated  ?                       the procedure well. ?Findings: ?     The examined esophagus was normal. ?     The entire examined stomach was normal. Biopsies were taken with a cold  ?     forceps for Helicobacter pylori testing. Estimated blood loss was  ?     minimal. ?     The examined duodenum was normal. ?Impression:            - Normal esophagus. ?                       - Normal stomach. Biopsied. ?                       - Normal examined duodenum. ?Recommendation:        - Await pathology results. ?                        - Perform a colonoscopy today. ?Procedure Code(s):     --- Professional --- ?                       [phone], Esophagogastroduodenoscopy, flexible,  ?                       transoral; with biopsy, single or multiple ?Diagnosis Code(s):     --- Professional --- ?                       R10.84, Generalized abdominal pain ?                       R10.13, Epigastric pain ?CPT copyright 2019 American Medical Association. All rights reserved. ?The codes documented in this report are preliminary and upon coder review may  ?be revised to meet current compliance requirements. ?Andrey Farmer MD, MD ?12/08/2021 9:37:40 AM ?Number of Addenda: 0 ?Note Initiated On: 12/08/2021 8:44 AM ?Estimated Blood Loss:  Estimated blood loss was minimal. ?     [facility] ?

## 2021-12-08 NOTE — Transfer of Care (Signed)
Immediate Anesthesia Transfer of Care Note ? ?Patient: Luis Williams ? ?Procedure(s) Performed: COLONOSCOPY WITH PROPOFOL ?ESOPHAGOGASTRODUODENOSCOPY (EGD) WITH PROPOFOL ? ?Patient Location: Endoscopy Unit ? ?Anesthesia Type:MAC ? ?Level of Consciousness: awake, alert  and oriented ? ?Airway & Oxygen Therapy: Patient Spontanous Breathing and Patient connected to nasal cannula oxygen ? ?Post-op Assessment: Report given to RN, Post -op Vital signs reviewed and stable and Patient moving all extremities ? ?Post vital signs: Reviewed and stable ? ?Last Vitals:  ?Vitals Value Taken Time  ?BP 121/72 12/08/21 0937  ?Temp 35.8 ?C 12/08/21 0937  ?Pulse 79 12/08/21 0937  ?Resp 20 12/08/21 0937  ?SpO2 99 % 12/08/21 0937  ? ? ?Last Pain:  ?Vitals:  ? 12/08/21 0937  ?TempSrc: Temporal  ?PainSc: 0-No pain  ?   ? ?  ? ?Complications: No notable events documented. ?

## 2021-12-08 NOTE — Anesthesia Preprocedure Evaluation (Signed)
Anesthesia Evaluation  ?Patient identified by MRN, date of birth, ID band ?Patient awake ? ? ? ?Reviewed: ?Allergy & Precautions, H&P , NPO status , Patient's Chart, lab work & pertinent test results, reviewed documented beta blocker date and time  ? ?Airway ?Mallampati: II ? ? ?Neck ROM: full ? ? ? Dental ? ?(+) Poor Dentition ?  ?Pulmonary ?neg pulmonary ROS, former smoker,  ?  ?Pulmonary exam normal ? ? ? ? ? ? ? Cardiovascular ?Exercise Tolerance: Good ?negative cardio ROS ?Normal cardiovascular exam ?Rhythm:regular Rate:Normal ? ? ?  ?Neuro/Psych ?negative neurological ROS ? negative psych ROS  ? GI/Hepatic ?Neg liver ROS, GERD  Medicated,  ?Endo/Other  ?negative endocrine ROSdiabetes, Well Controlled, Type 2, Oral Hypoglycemic Agents ? Renal/GU ?negative Renal ROS  ?negative genitourinary ?  ?Musculoskeletal ? ? Abdominal ?  ?Peds ? Hematology ?negative hematology ROS ?(+)   ?Anesthesia Other Findings ?Past Medical History: ?No date: Allergic rhinitis ?10/28/2015: Controlled type 2 diabetes mellitus ([place]) ?02/07/2018: Fatty liver ?10/28/2015: Gallbladder polyp ?No date: GERD (gastroesophageal reflux disease) ?11/11/2015: Hypercholesterolemia ?Past Surgical History: ?No date: EYE SURGERY ?No date: HERNIA REPAIR ?No date: LAMINECTOMY AND MICRODISCECTOMY LUMBAR SPINE ?No date: ORIF ANKLE FRACTURE ? ? Reproductive/Obstetrics ?negative OB ROS ? ?  ? ? ? ? ? ? ? ? ? ? ? ? ? ?  ?  ? ? ? ? ? ? ? ? ?Anesthesia Physical ?Anesthesia Plan ? ?ASA: 2 ? ?Anesthesia Plan: General  ? ?Post-op Pain Management:   ? ?Induction:  ? ?PONV Risk Score and Plan:  ? ?Airway Management Planned:  ? ?Additional Equipment:  ? ?Intra-op Plan:  ? ?Post-operative Plan:  ? ?Informed Consent: I have reviewed the patients History and Physical, chart, labs and discussed the procedure including the risks, benefits and alternatives for the proposed anesthesia with the patient or authorized representative who has  indicated his/her understanding and acceptance.  ? ? ? ?Dental Advisory Given ? ?Plan Discussed with: CRNA ? ?Anesthesia Plan Comments:   ? ? ? ? ? ? ?Anesthesia Quick Evaluation ? ?

## 2021-12-08 NOTE — Interval H&P Note (Signed)
History and Physical Interval Note: ? ?12/08/2021 ?9:05 AM ? ?Luis Williams  has presented today for surgery, with the diagnosis of abdominal pain, weight loss, dyspepsia,bowel habit changes.  The various methods of treatment have been discussed with the patient and family. After consideration of risks, benefits and other options for treatment, the patient has consented to  Procedure(s) with comments: ?COLONOSCOPY WITH PROPOFOL (N/A) - DM ?ESOPHAGOGASTRODUODENOSCOPY (EGD) WITH PROPOFOL (N/A) as a surgical intervention.  The patient's history has been reviewed, patient examined, no change in status, stable for surgery.  I have reviewed the patient's chart and labs.  Questions were answered to the patient's satisfaction.   ? ? ?Hilton Cork Verenice Westrich ? ?Ok to proceed with EGD/Colonoscopy ?

## 2021-12-08 NOTE — H&P (Signed)
Outpatient short stay form Pre-procedure ?12/08/2021  ?Lesly Rubenstein, MD ? ?Primary Physician: [facility] ? ?Reason for visit:  Dyspepsia/Abdominal pain ? ?History of present illness:   ? ?61 y/o gentleman with history of DM II, hypertension, and HLD here for EGD/Colonoscopy for dyspepsia/abdominal pain. Underwent EUS recently for abnormal pancreas findings but EUS was overall not concerning. Patient's last colonoscopy was > 10 years ago. No blood thinners. No family history of GI malignancies. History of ventral hernia repair. ? ? ? ?Current Facility-Administered Medications:  ?  0.9 %  sodium chloride infusion, , Intravenous, Continuous, Calla Wedekind, Hilton Cork, MD, Last Rate: 20 mL/hr at 12/08/21 0903, Continued from Pre-op at 12/08/21 0903 ? ?Medications Prior to Admission  ?Medication Sig Dispense Refill Last Dose  ? aspirin 81 MG chewable tablet Chew by mouth.   Past Week  ? azelastine (ASTELIN) 0.1 % nasal spray Place 2 sprays into both nostrils 2 (two) times daily. Use in each nostril as directed   Past Week at prn  ? glipiZIDE (GLUCOTROL) 10 MG tablet Take 10 mg by mouth 2 (two) times daily.   12/07/2021  ? hydrocortisone 2.5 % lotion Apply topically 2 (two) times daily.   12/07/2021  ? ketoconazole (NIZORAL) 2 % shampoo Apply 1 application. topically 2 (two) times a week.   12/07/2021  ? losartan (COZAAR) 25 MG tablet SMARTSIG:.5 Tablet(s) By Mouth Daily   Past Week  ? metFORMIN (GLUCOPHAGE) 500 MG tablet Take 500 mg by mouth 2 (two) times daily.   Past Week  ? mometasone (ELOCON) 0.1 % lotion Apply topically daily.   12/07/2021  ? senna (SENOKOT) 8.6 MG tablet Take 1 tablet by mouth daily as needed for constipation.    at prn  ? sildenafil (VIAGRA) 100 MG tablet Take 100 mg by mouth daily as needed for erectile dysfunction.    at prn  ? triamcinolone ointment (KENALOG) 0.1 % Apply 1 application. topically 2 (two) times daily.   12/07/2021  ? cetirizine (ZYRTEC) 10 MG tablet Take 10 mg by mouth  daily.    at prn  ? montelukast (SINGULAIR) 10 MG tablet Take 10 mg by mouth at bedtime. (Patient not taking: Reported on 12/08/2021)   Not Taking  ? omeprazole (PRILOSEC OTC) 20 MG tablet Take 1 tablet (20 mg total) by mouth daily. 28 tablet 1   ? omeprazole (PRILOSEC) 20 MG capsule Take 20 mg by mouth daily. (Patient not taking: Reported on 12/08/2021)   Completed Course  ? ondansetron (ZOFRAN ODT) 4 MG disintegrating tablet Take 1 tablet (4 mg total) by mouth every 8 (eight) hours as needed for nausea or vomiting. (Patient not taking: Reported on 11/05/2019) 20 tablet 0   ? simvastatin (ZOCOR) 20 MG tablet Take 20 mg by mouth daily. (Patient not taking: Reported on 12/08/2021)   Not Taking  ? tamsulosin (FLOMAX) 0.4 MG CAPS capsule Take 0.4 mg by mouth daily. (Patient not taking: Reported on 12/08/2021)   Completed Course  ? ? ? ?No Known Allergies ? ? ?Past Medical History:  ?Diagnosis Date  ? Allergic rhinitis   ? Controlled type 2 diabetes mellitus ([place]) 10/28/2015  ? Fatty liver 02/07/2018  ? Gallbladder polyp 10/28/2015  ? GERD (gastroesophageal reflux disease)   ? Hypercholesterolemia 11/11/2015  ? ? ?Review of systems:  Otherwise negative.  ? ? ?Physical Exam ? ?Gen: Alert, oriented. Appears stated age.  ?HEENT: PERRLA. ?Lungs: No respiratory distress ?CV: RRR ?Abd: soft, benign, no masses ?Ext: No edema ? ? ? ?  Planned procedures: Proceed with EGD/colonoscopy. The patient understands the nature of the planned procedure, indications, risks, alternatives and potential complications including but not limited to bleeding, infection, perforation, damage to internal organs and possible oversedation/side effects from anesthesia. The patient agrees and gives consent to proceed.  ?Please refer to procedure notes for findings, recommendations and patient disposition/instructions.  ? ? ? ?Lesly Rubenstein, MD ?[facility] ? ? ? ?  ? ?

## 2021-12-08 NOTE — Op Note (Signed)
[facility] ?Gastroenterology ?Patient Name: Luis Williams ?Procedure Date: 12/08/2021 8:44 AM ?MRN: [id number] ?Account #: [id number] ?Date of Birth: 1960/11/02 ?Admit Type: Outpatient ?Age: 61 ?Room: [facility] ENDO ROOM 1 ?Gender: Male ?Note Status: Finalized ?Instrument Name: Colonoscope [id number] ?Procedure:             Colonoscopy ?Indications:           Generalized abdominal pain, Change in bowel habits ?Providers:             Andrey Farmer MD, MD ?Medicines:             Monitored Anesthesia Care ?Complications:         No immediate complications. Estimated blood loss:  ?                       Minimal. ?Procedure:             Pre-Anesthesia Assessment: ?                       - Prior to the procedure, a History and Physical was  ?                       performed, and patient medications and allergies were  ?                       reviewed. The patient is competent. The risks and  ?                       benefits of the procedure and the sedation options and  ?                       risks were discussed with the patient. All questions  ?                       were answered and informed consent was obtained.  ?                       Patient identification and proposed procedure were  ?                       verified by the physician, the nurse, the  ?                       anesthesiologist, the anesthetist and the technician  ?                       in the endoscopy suite. Mental Status Examination:  ?                       alert and oriented. Airway Examination: normal  ?                       oropharyngeal airway and neck mobility. Respiratory  ?                       Examination: clear to auscultation. CV Examination:  ?                       normal. Prophylactic Antibiotics: The patient does not  ?  require prophylactic antibiotics. Prior  ?                       Anticoagulants: The patient has taken no previous  ?                       anticoagulant or antiplatelet agents.  ASA Grade  ?                       Assessment: II - A patient with mild systemic disease.  ?                       After reviewing the risks and benefits, the patient  ?                       was deemed in satisfactory condition to undergo the  ?                       procedure. The anesthesia plan was to use monitored  ?                       anesthesia care (MAC). Immediately prior to  ?                       administration of medications, the patient was  ?                       re-assessed for adequacy to receive sedatives. The  ?                       heart rate, respiratory rate, oxygen saturations,  ?                       blood pressure, adequacy of pulmonary ventilation, and  ?                       response to care were monitored throughout the  ?                       procedure. The physical status of the patient was  ?                       re-assessed after the procedure. ?                       After obtaining informed consent, the colonoscope was  ?                       passed under direct vision. Throughout the procedure,  ?                       the patient's blood pressure, pulse, and oxygen  ?                       saturations were monitored continuously. The  ?                       Colonoscope was introduced through the anus and  ?  advanced to the the cecum, identified by appendiceal  ?                       orifice and ileocecal valve. The colonoscopy was  ?                       performed without difficulty. The patient tolerated  ?                       the procedure well. The quality of the bowel  ?                       preparation was good. ?Findings: ?     The perianal and digital rectal examinations were normal. ?     Two sessile polyps were found in the cecum. The polyps were 3 to 5 mm in  ?     size. These polyps were removed with a cold snare. Resection and  ?     retrieval were complete. Estimated blood loss was minimal. ?     A 3 mm polyp was found in the  transverse colon. The polyp was sessile.  ?     The polyp was removed with a cold snare. Resection and retrieval were  ?     complete. Estimated blood loss was minimal. ?     Internal hemorrhoids were found during retroflexion. The hemorrhoids  ?     were Grade I (internal hemorrhoids that do not prolapse). ?     The exam was otherwise without abnormality on direct and retroflexion  ?     views. ?Impression:            - Two 3 to 5 mm polyps in the cecum, removed with a  ?                       cold snare. Resected and retrieved. ?                       - One 3 mm polyp in the transverse colon, removed with  ?                       a cold snare. Resected and retrieved. ?                       - Internal hemorrhoids. ?                       - The examination was otherwise normal on direct and  ?                       retroflexion views. ?Recommendation:        - Discharge patient to home. ?                       - Resume previous diet. ?                       - Continue present medications. ?                       - Await pathology results. ?                       -  Repeat colonoscopy in 3 years for surveillance. ?                       - Return to referring physician as previously  ?                       scheduled. ?Procedure Code(s):     --- Professional --- ?                       [phone], Colonoscopy, flexible; with removal of  ?                       tumor(s), polyp(s), or other lesion(s) by snare  ?                       technique ?Diagnosis Code(s):     --- Professional --- ?                       K63.5, Polyp of colon ?                       K64.0, First degree hemorrhoids ?                       R10.84, Generalized abdominal pain ?                       R19.4, Change in bowel habit ?CPT copyright 2019 [employer]. All rights reserved. ?The codes documented in this report are preliminary and upon coder review may  ?be revised to meet current compliance requirements. ?Andrey Farmer MD,  MD ?12/08/2021 9:42:10 AM ?Number of Addenda: 0 ?Note Initiated On: 12/08/2021 8:44 AM ?Scope Withdrawal Time: 0 hours 12 minutes 30 seconds  ?Total Procedure Duration: 0 hours 15 minutes 58 seconds  ?Estimated Blood Loss:  Estimated blood loss was minimal. ?     [facility] ?

## 2021-12-09 ENCOUNTER — Encounter: Payer: Self-pay | Admitting: Gastroenterology

## 2021-12-09 LAB — SURGICAL PATHOLOGY

## 2021-12-09 NOTE — Anesthesia Postprocedure Evaluation (Signed)
Anesthesia Post Note ? ?Patient: Luis Williams ? ?Procedure(s) Performed: COLONOSCOPY WITH PROPOFOL ?ESOPHAGOGASTRODUODENOSCOPY (EGD) WITH PROPOFOL ? ?Patient location during evaluation: PACU ?Anesthesia Type: MAC ?Level of consciousness: awake and alert ?Pain management: pain level controlled ?Vital Signs Assessment: post-procedure vital signs reviewed and stable ?Respiratory status: spontaneous breathing, nonlabored ventilation, respiratory function stable and patient connected to nasal cannula oxygen ?Cardiovascular status: blood pressure returned to baseline and stable ?Postop Assessment: no apparent nausea or vomiting ?Anesthetic complications: no ? ? ?No notable events documented. ? ? ?Last Vitals:  ?Vitals:  ? 12/08/21 0947 12/08/21 0957  ?BP: 114/86 127/82  ?Pulse: 77 73  ?Resp: 20 (!) 22  ?Temp:    ?SpO2: 100% 100%  ?  ?Last Pain:  ?Vitals:  ? 12/08/21 0957  ?TempSrc:   ?PainSc: 0-No pain  ? ? ?  ?  ?  ?  ?  ?  ? ?Molli Barrows ? ? ? ? ?

## 2022-03-28 IMAGING — MR MR ABDOMEN WO/W CM MRCP
14 of 19 series · 32 of 48 positions shown · IV contrast (multihance)
Comparison: CT abdomen 04/18/2019

CLINICAL DATA: Abdominal pain for 2 weeks

EXAM:
MRI ABDOMEN WITHOUT AND WITH CONTRAST (INCLUDING MRCP)
TECHNIQUE: Multiplanar multisequence MR imaging of the abdomen was performed
both before and after the administration of intravenous contrast.
Heavily T2-weighted images of the biliary and pancreatic ducts were
obtained, and three-dimensional MRCP images were rendered by post
processing.
CONTRAST:  20mL MULTIHANCE GADOBENATE DIMEGLUMINE 529 MG/ML IV SOLN

[Series 3: T2 · coronal · 5.0mm · 1.56mm/px · 2 of 39 slices shown (1 of 4)]
[im 1/39]
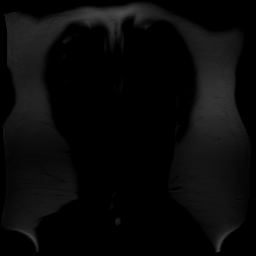
[im 39/39]
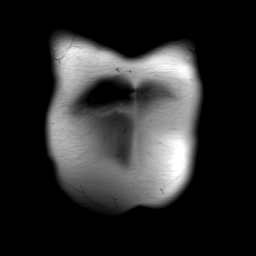

[Series 4: ax in out · axial · 3.5mm · 0.74mm/px · z∈[-169,+24]mm · 4 of 98 slices shown]
[im 1/98]
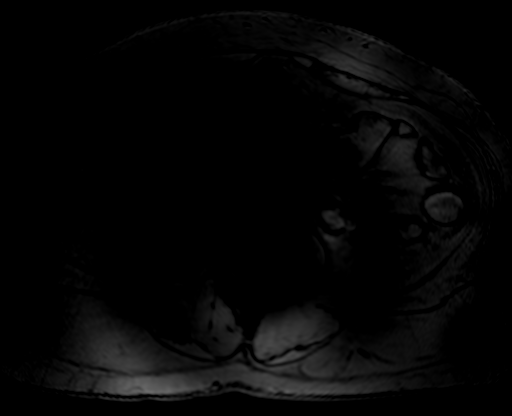
[im 33/98]
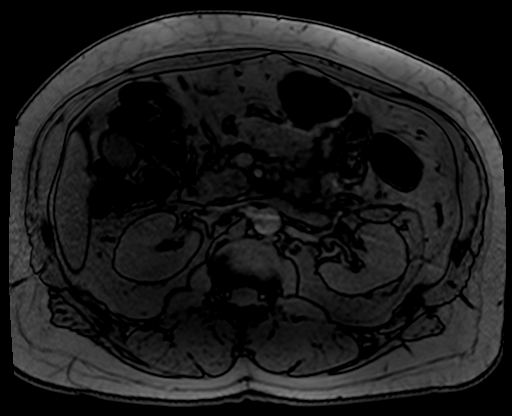
[im 65/98]
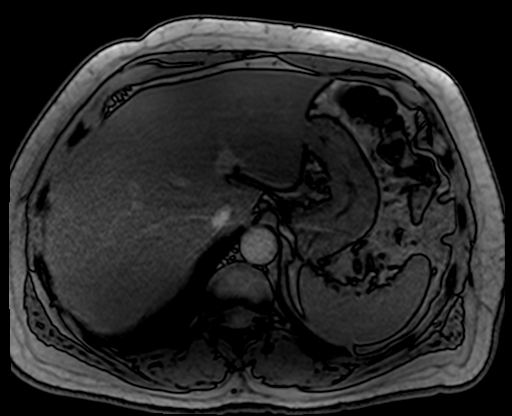
[im 98/98]
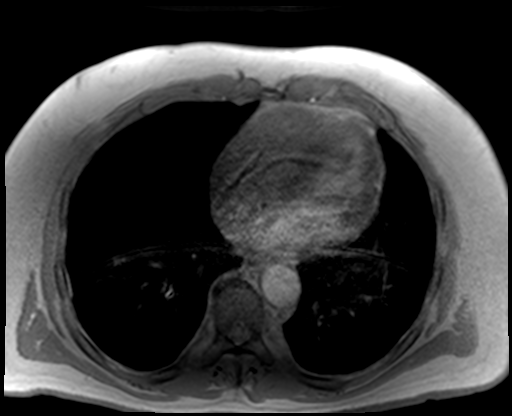

[Series 5: T2 · axial · 5.0mm · 1.48mm/px · 1 of 29 slices shown (2 of 4)]
[im 1/29]
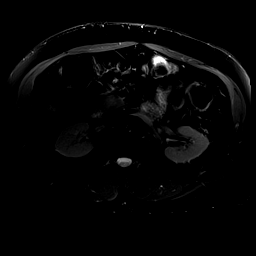

[Series 6: T2 · axial · 6.0mm · 1.22mm/px · 1 of 30 slices shown (3 of 4)]
[im 1/30]
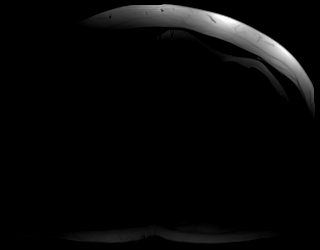

[Series 7: DWI · axial · 5.0mm · 1.42mm/px · z∈[-139,+65]mm · 4 of 105 slices shown (1 of 2)]
[im 1/105]
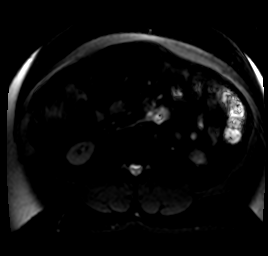
[im 35/105]
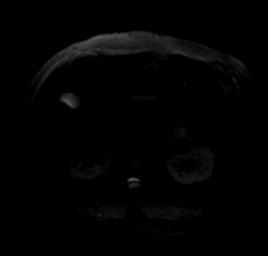
[im 70/105]
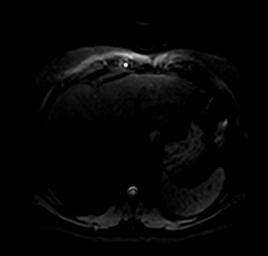
[im 105/105]
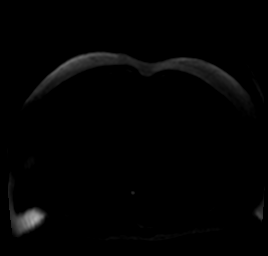

[Series 8: DWI · axial · 5.0mm · 1.42mm/px · 1 of 35 slices shown (2 of 2)]
[im 1/35]
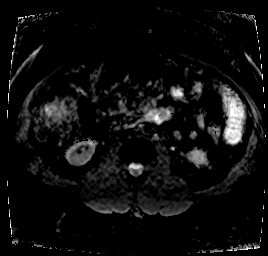

[Series 9: bSSFP · axial · 5.0mm · 1.25mm/px · 1 of 38 slices shown]
[im 1/38]
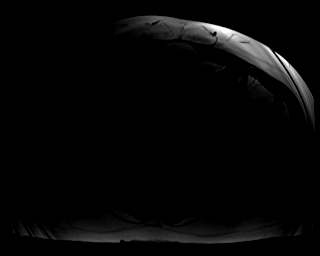

[Series 12: T2 · coronal · 3.0mm · 1.19mm/px · 1 of 19 slices shown (4 of 4)]
[im 1/19]
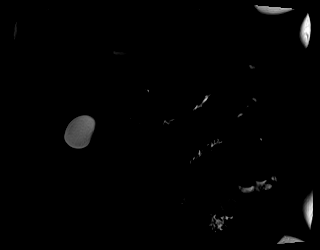

[Series 13: MRCP · coronal · 1.0mm · 0.49mm/px · 3 of 80 slices shown]
[im 1/80]
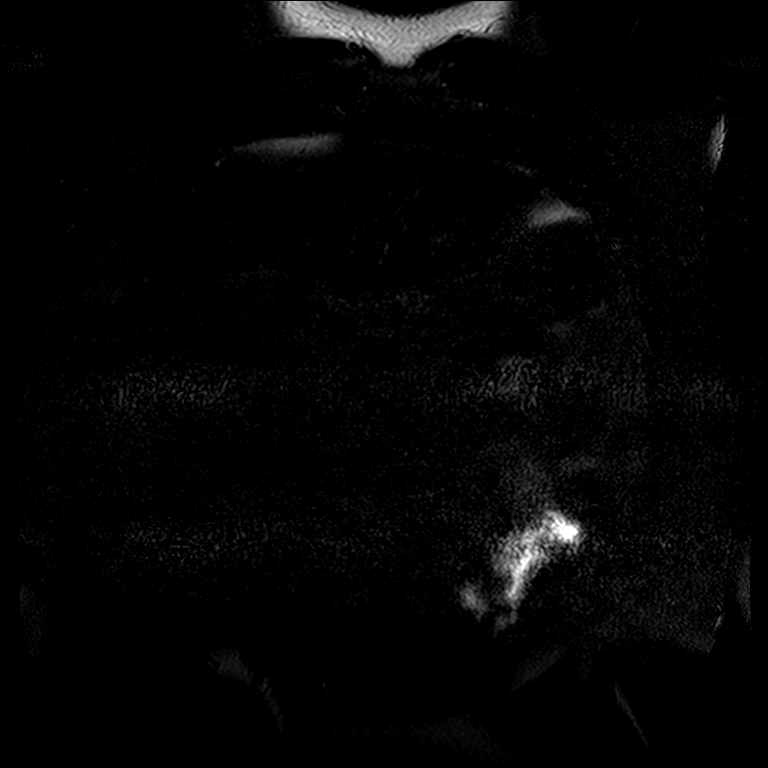
[im 40/80]
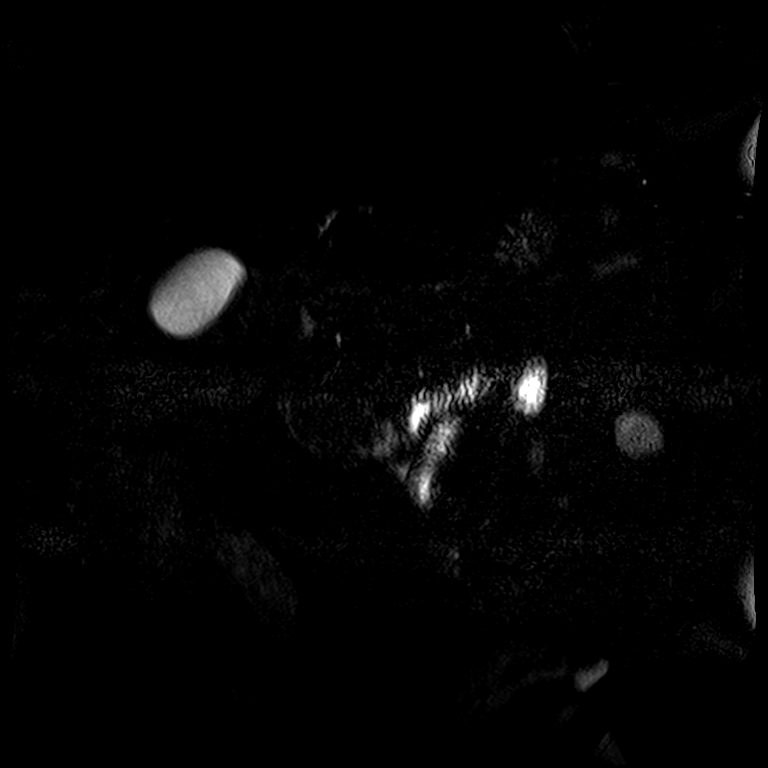
[im 80/80]
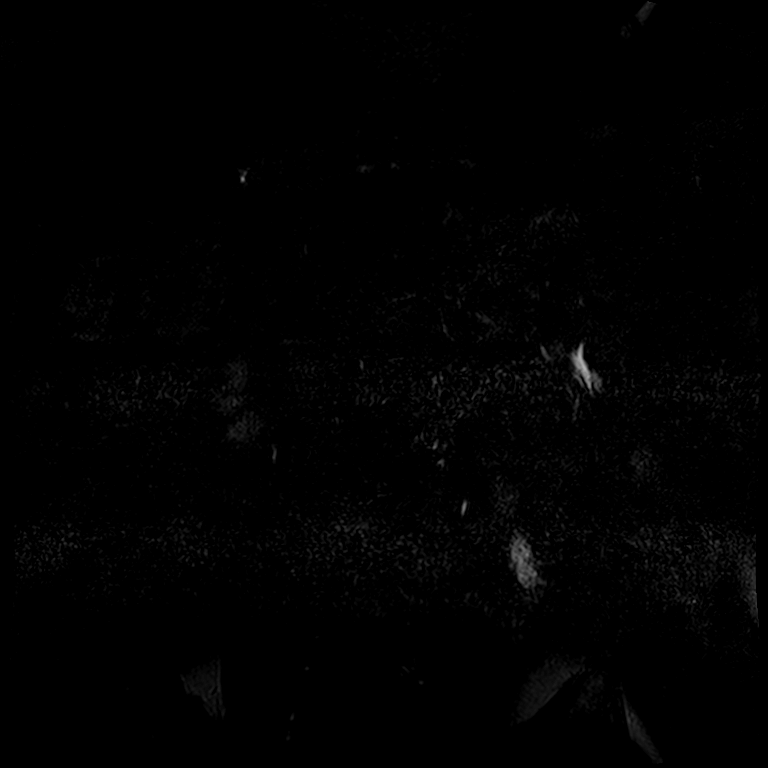

[Series 17: T1 dynamic · axial · non-contrast · 3.0mm · 1.25mm/px · z∈[-193,+44]mm · 3 of 80 slices shown]
[im 1/80]
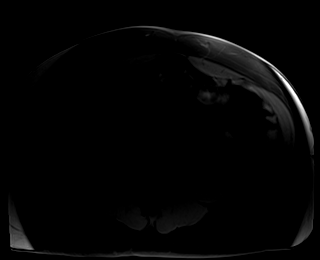
[im 40/80]
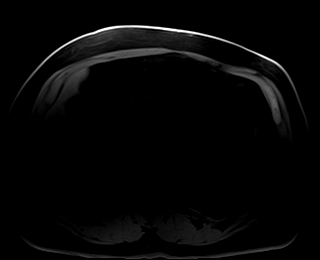
[im 80/80]
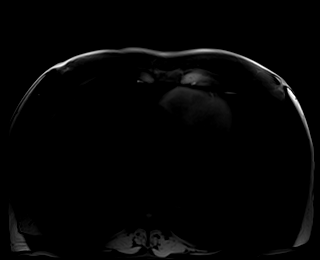

[Series 18: T1 dynamic post-contrast · axial · 3.0mm · 1.25mm/px · z∈[-193,+44]mm · 3 of 80 slices shown (1 of 4)]
[im 1/80]
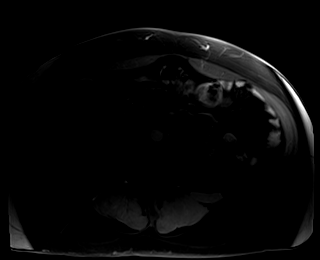
[im 40/80]
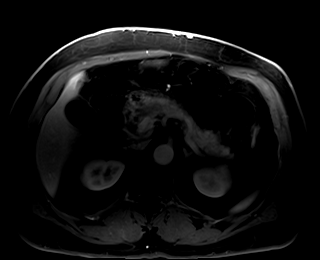
[im 80/80]
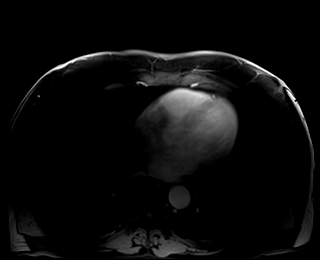

[Series 19: T1 dynamic post-contrast · axial · 3.0mm · 1.25mm/px · z∈[-193,+44]mm · 3 of 80 slices shown (2 of 4)]
[im 1/80]
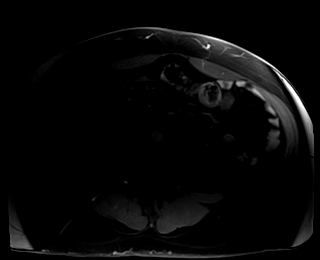
[im 40/80]
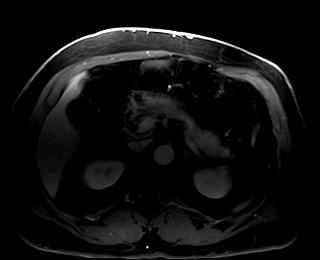
[im 80/80]
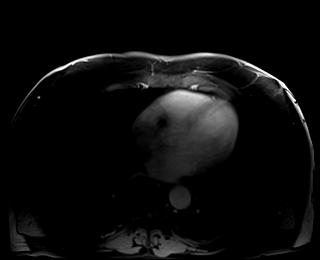

[Series 20: T1 dynamic post-contrast · axial · 3.0mm · 1.25mm/px · z∈[-193,+44]mm · 3 of 80 slices shown (3 of 4)]
[im 1/80]
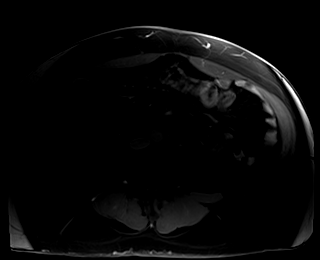
[im 40/80]
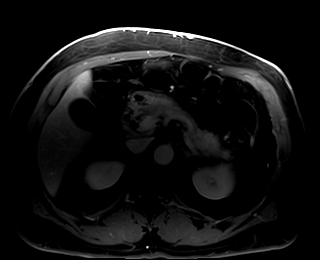
[im 80/80]
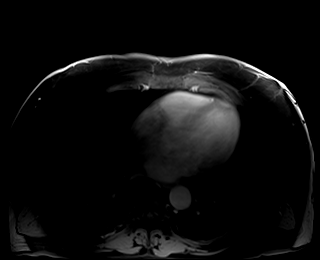

[Series 21: T1 dynamic post-contrast · coronal · 3.0mm · 1.25mm/px · 2 of 72 slices shown (4 of 4)]
[im 1/72]
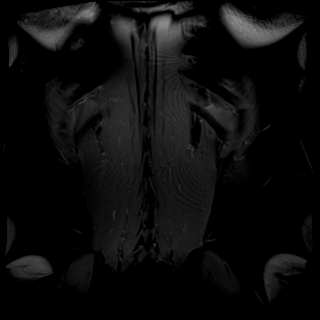
[im 36/72]
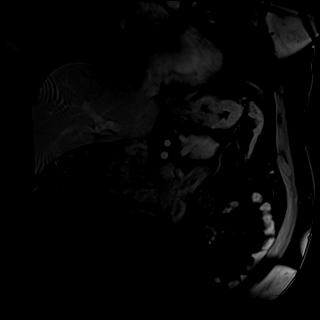

[32 of 48 positions shown; findings below may reference images not displayed]

FINDINGS: Lower chest: Unremarkable

Hepatobiliary: Posteriorly in the dome of the right hepatic lobe
(segment VII) there is a 1.0 by 0.6 cm T2 hyperintense lesion which
demonstrates progressive centripetal enhancement and delayed
enhancement, compatible with a small hemangioma. Stable size from
04/18/2019.

The liver appears otherwise normal. The gallbladder appears normal
and there is no biliary dilatation or abnormal filling defect in the
biliary tree.

Pancreas: Abnormal peripancreatic edema involving the pancreatic
tail and peripancreatic tissues with some indistinctness of dorsal
pancreatic duct in the involved regions. Associated restricted
diffusion noted. Subtle accentuated enhancement on all phases in the
involved region, without the infiltrative hypoenhancing appearance
characteristic of adenocarcinoma. No findings of pancreatic
necrosis, abscess, or pseudocyst. No dilation of the dorsal
pancreatic duct.

Spleen:  Unremarkable

Adrenals/Urinary Tract: 1.4 by 1.0 cm Bosniak category 2 cyst of the
right kidney upper pole on image 12 of series 3 with a 1 mm thick
internal septation but no worrisome characteristics. Adrenal glands
unremarkable.

Stomach/Bowel: Unremarkable

Vascular/Lymphatic:  Unremarkable

Other:  No supplemental non-categorized findings.

Musculoskeletal: Mild lumbar degenerative disc disease.
IMPRESSION: 1. Pancreatic and peripancreatic edema along an approximately 6 cm
segment of the distal pancreatic tail, with very faintly accentuated
parenchymal enhancement in this vicinity on all post-contrast
series. No infiltrative hypoenhancing appearance to indicate
pancreatic adenocarcinoma; no findings of pancreatic abscess,
pseudocyst, or necrosis. Overall given the appearance, focal
pancreatitis is favored; consider follow up pancreatic protocol MRI
in 3 months time after resolution of the patient's symptoms in order
to reassess this region and ensure expected benign appearance.
2. Small benign hemangioma in segment 7 of the liver.
3. Small benign Bosniak category 2 cyst of the right kidney upper
pole.

## 2022-04-12 ENCOUNTER — Telehealth (INDEPENDENT_AMBULATORY_CARE_PROVIDER_SITE_OTHER): Payer: Self-pay

## 2022-04-12 ENCOUNTER — Other Ambulatory Visit (INDEPENDENT_AMBULATORY_CARE_PROVIDER_SITE_OTHER): Payer: Self-pay | Admitting: Nurse Practitioner

## 2022-04-12 DIAGNOSIS — I83813 Varicose veins of bilateral lower extremities with pain: Secondary | ICD-10-CM

## 2022-04-12 NOTE — Telephone Encounter (Signed)
Pt LVM stating someone called him.  I see a referral in the system.  Will someone reach back out to him.  Thanks

## 2022-04-13 ENCOUNTER — Ambulatory Visit (INDEPENDENT_AMBULATORY_CARE_PROVIDER_SITE_OTHER): Payer: Self-pay

## 2022-04-13 ENCOUNTER — Ambulatory Visit (INDEPENDENT_AMBULATORY_CARE_PROVIDER_SITE_OTHER): Payer: 59 | Admitting: Vascular Surgery

## 2022-04-13 ENCOUNTER — Encounter (INDEPENDENT_AMBULATORY_CARE_PROVIDER_SITE_OTHER): Payer: Self-pay | Admitting: Vascular Surgery

## 2022-04-13 VITALS — BP 137/81 | HR 66 | Resp 16 | Wt 242.6 lb

## 2022-04-13 DIAGNOSIS — I83811 Varicose veins of right lower extremities with pain: Secondary | ICD-10-CM

## 2022-04-13 DIAGNOSIS — E119 Type 2 diabetes mellitus without complications: Secondary | ICD-10-CM

## 2022-04-13 DIAGNOSIS — E7800 Pure hypercholesterolemia, unspecified: Secondary | ICD-10-CM

## 2022-04-13 DIAGNOSIS — I83813 Varicose veins of bilateral lower extremities with pain: Secondary | ICD-10-CM

## 2022-04-13 NOTE — Assessment & Plan Note (Signed)
lipid control important in reducing the progression of atherosclerotic disease. Continue statin therapy  

## 2022-04-13 NOTE — Assessment & Plan Note (Signed)
Venous reflux study was performed today demonstrating significant right great saphenous vein reflux with no significant left leg reflux.  No DVT or superficial thrombophlebitis was identified.  The patient is getting no benefit from compression socks and elevation.  We discussed the role for laser ablation of the right great saphenous vein which would be a reasonable option to treat his symptomatic right lower extremity venous disease.  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  I discussed the risks and benefits of the procedure.  Patient would be interested in proceeding.

## 2022-04-13 NOTE — Assessment & Plan Note (Signed)
blood glucose control important in reducing the progression of atherosclerotic disease. Also, involved in wound healing. On appropriate medications.  

## 2022-04-13 NOTE — Patient Instructions (Signed)
Chronic Venous Insufficiency Chronic venous insufficiency is a condition where the leg veins cannot effectively pump blood from the legs to the heart. This happens when the vein walls are either stretched, weakened, or damaged, or when the valves inside the vein are damaged. With the right treatment, you should be able to continue with an active life. This condition is also called venous stasis. What are the causes? Common causes of this condition include: High blood pressure inside the veins (venous hypertension). Sitting or standing too long, causing increased blood pressure in the leg veins. A blood clot that blocks blood flow in a vein (deep vein thrombosis, DVT). Inflammation of a vein (phlebitis) that causes a blood clot to form. Tumors in the pelvis that cause blood to back up. What increases the risk? The following factors may make you more likely to develop this condition: Having a family history of this condition. Obesity. Pregnancy. Living without enough regular physical activity or exercise (sedentary lifestyle). Smoking. Having a job that requires long periods of standing or sitting in one place. Being a certain age. Women in their 21s and 52s and men in their 97s are more likely to develop this condition. What are the signs or symptoms? Symptoms of this condition include: Veins that are enlarged, bulging, or twisted (varicose veins). Skin breakdown or ulcers. Reddened skin or dark discoloration of skin on the leg between the knee and ankle. Brown, smooth, tight, and painful skin just above the ankle, usually on the inside of the leg (lipodermatosclerosis). Swelling of the legs. How is this diagnosed? This condition may be diagnosed based on: Your medical history. A physical exam. Tests, such as: A procedure that creates an image of a blood vessel and nearby organs and provides information about blood flow through the blood vessel (duplex ultrasound). A procedure that  tests blood flow (plethysmography). A procedure that looks at the veins using X-ray and dye (venogram). How is this treated? The goals of treatment are to help you return to an active life and to minimize pain or disability. Treatment depends on the severity of your condition, and it may include: Wearing compression stockings. These can help relieve symptoms and help prevent your condition from getting worse. However, they do not cure the condition. Sclerotherapy. This procedure involves an injection of a solution that shrinks damaged veins. Surgery. This may involve: Removing a diseased vein (vein stripping). Cutting off blood flow through the vein (laser ablation surgery). Repairing or reconstructing a valve within the affected vein. Follow these instructions at home:     Wear compression stockings as told by your health care provider. These stockings help to prevent blood clots and reduce swelling in your legs. Take over-the-counter and prescription medicines only as told by your health care provider. Stay active by exercising, walking, or doing different activities. Ask your health care provider what activities are safe for you and how much exercise you need. Drink enough fluid to keep your urine pale yellow. Do not use any products that contain nicotine or tobacco, such as cigarettes, e-cigarettes, and chewing tobacco. If you need help quitting, ask your health care provider. Keep all follow-up visits as told by your health care provider. This is important. Contact a health care provider if you: Have redness, swelling, or more pain in the affected area. See a red streak or line that goes up or down from the affected area. Have skin breakdown or skin loss in the affected area, even if the breakdown is small. Get  an injury in the affected area. Get help right away if: You get an injury and an open wound in the affected area. You have: Severe pain that does not get better with  medicine. Sudden numbness or weakness in the foot or ankle below the affected area. Trouble moving your foot or ankle. A fever. Worse or persistent symptoms. Chest pain. Shortness of breath. Summary Chronic venous insufficiency is a condition where the leg veins cannot effectively pump blood from the legs to the heart. Chronic venous insufficiency occurs when the vein walls become stretched, weakened, or damaged, or when valves within the vein are damaged. Treatment depends on how severe your condition is. It often involves wearing compression stockings and may involve having a procedure. Make sure you stay active by exercising, walking, or doing different activities. Ask your health care provider what activities are safe for you and how much exercise you need. This information is not intended to replace advice given to you by your health care provider. Make sure you discuss any questions you have with your health care provider. Document Revised: 10/21/2020 Document Reviewed: 10/21/2020 [employer] Patient Education  2023 [employer]  

## 2022-04-13 NOTE — Progress Notes (Signed)
Patient ID: Luis Williams, male   DOB: [birth year], 61 y.o.   MRN: [id number]  Chief Complaint  Patient presents with   New Patient (Initial Visit)    Ref Mateo Flow consult bilateral vv w/pain    HPI Luis Williams is a 61 y.o. male.  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  This is associated with some swelling.  The patient reports no previous history of DVT or superficial thrombophlebitis to his knowledge.  These veins have gotten larger and more painful over several years.  There was no clear cause or inciting event that started the symptoms.  He is worn compression socks without significant relief.  He does try to elevate his legs.  He really has no current left leg symptoms other than occasional swelling.  Venous reflux study was performed today demonstrating significant right great saphenous vein reflux with no significant left leg reflux.  No DVT or superficial thrombophlebitis was identified.     Past Medical History:  Diagnosis Date   Allergic rhinitis    Controlled type 2 diabetes mellitus ([place]) 10/28/2015   Fatty liver 02/07/2018   Gallbladder polyp 10/28/2015   GERD (gastroesophageal reflux disease)    Hypercholesterolemia 11/11/2015    Past Surgical History:  Procedure Laterality Date   BACK SURGERY     COLONOSCOPY WITH PROPOFOL N/A 12/08/2021   Procedure: COLONOSCOPY WITH PROPOFOL;  Surgeon: Lesly Rubenstein, MD;  Location: [facility] ENDOSCOPY;  Service: Endoscopy;  Laterality: N/A;  DM   ESOPHAGOGASTRODUODENOSCOPY (EGD) WITH PROPOFOL N/A 12/08/2021   Procedure: ESOPHAGOGASTRODUODENOSCOPY (EGD) WITH PROPOFOL;  Surgeon: Lesly Rubenstein, MD;  Location: [facility] ENDOSCOPY;  Service: Endoscopy;  Laterality: N/A;   EYE SURGERY     FRACTURE SURGERY     HERNIA REPAIR     LAMINECTOMY AND MICRODISCECTOMY LUMBAR SPINE     ORIF ANKLE FRACTURE       Family History  Problem Relation Age of Onset    Diabetes type II Father    Coronary artery disease Father    Hypertension Sister    Diabetes type II Brother    Hypertension Brother       Social History   Tobacco Use   Smoking status: Former    Types: Cigars    Quit date: 02/10/2015    Years since quitting: 7.1   Smokeless tobacco: Never  Vaping Use   Vaping Use: Never used  Substance Use Topics   Alcohol use: Not Currently   Drug use: No     No Known Allergies  Current Outpatient Medications  Medication Sig Dispense Refill   aspirin 81 MG chewable tablet Chew by mouth.     azelastine (ASTELIN) 0.1 % nasal spray Place 2 sprays into both nostrils 2 (two) times daily. Use in each nostril as directed     cetirizine (ZYRTEC) 10 MG tablet Take 10 mg by mouth daily.     glipiZIDE (GLUCOTROL) 10 MG tablet Take 10 mg by mouth 2 (two) times daily.     hydrocortisone 2.5 % lotion Apply topically 2 (two) times daily.     ketoconazole (NIZORAL) 2 % shampoo Apply 1 application. topically 2 (two) times a week.     losartan (COZAAR) 25 MG tablet SMARTSIG:.5 Tablet(s) By Mouth Daily     metFORMIN (GLUCOPHAGE) 500 MG tablet Take 500 mg by mouth 2 (two) times daily.     mometasone (ELOCON) 0.1 %  lotion Apply topically daily.     senna (SENOKOT) 8.6 MG tablet Take 1 tablet by mouth daily as needed for constipation.     sildenafil (VIAGRA) 100 MG tablet Take 100 mg by mouth daily as needed for erectile dysfunction.     triamcinolone ointment (KENALOG) 0.1 % Apply 1 application. topically 2 (two) times daily.     montelukast (SINGULAIR) 10 MG tablet Take 10 mg by mouth at bedtime. (Patient not taking: Reported on 12/08/2021)     omeprazole (PRILOSEC OTC) 20 MG tablet Take 1 tablet (20 mg total) by mouth daily. 28 tablet 1   omeprazole (PRILOSEC) 20 MG capsule Take 20 mg by mouth daily. (Patient not taking: Reported on 12/08/2021)     ondansetron (ZOFRAN ODT) 4 MG disintegrating tablet Take 1 tablet (4 mg total) by mouth every 8 (eight) hours as  needed for nausea or vomiting. (Patient not taking: Reported on 11/05/2019) 20 tablet 0   simvastatin (ZOCOR) 20 MG tablet Take 20 mg by mouth daily. (Patient not taking: Reported on 12/08/2021)     tamsulosin (FLOMAX) 0.4 MG CAPS capsule Take 0.4 mg by mouth daily. (Patient not taking: Reported on 12/08/2021)     No current facility-administered medications for this visit.      REVIEW OF SYSTEMS (Negative unless checked)  Constitutional: '[]'$ Weight loss  '[]'$ Fever  '[]'$ Chills Cardiac: '[]'$ Chest pain   '[]'$ Chest pressure   '[]'$ Palpitations   '[]'$ Shortness of breath when laying flat   '[]'$ Shortness of breath at rest   '[]'$ Shortness of breath with exertion. Vascular:  '[x]'$ Pain in legs with walking   '[x]'$ Pain in legs at rest   '[]'$ Pain in legs when laying flat   '[]'$ Claudication   '[]'$ Pain in feet when walking  '[]'$ Pain in feet at rest  '[]'$ Pain in feet when laying flat   '[]'$ History of DVT   '[]'$ Phlebitis   '[x]'$ Swelling in legs   '[x]'$ Varicose veins   '[]'$ Non-healing ulcers Pulmonary:   '[]'$ Uses home oxygen   '[]'$ Productive cough   '[]'$ Hemoptysis   '[]'$ Wheeze  '[]'$ COPD   '[]'$ Asthma Neurologic:  '[]'$ Dizziness  '[]'$ Blackouts   '[]'$ Seizures   '[]'$ History of stroke   '[]'$ History of TIA  '[]'$ Aphasia   '[]'$ Temporary blindness   '[]'$ Dysphagia   '[]'$ Weakness or numbness in arms   '[]'$ Weakness or numbness in legs Musculoskeletal:  '[]'$ Arthritis   '[]'$ Joint swelling   '[]'$ Joint pain   '[]'$ Low back pain Hematologic:  '[]'$ Easy bruising  '[]'$ Easy bleeding   '[]'$ Hypercoagulable state   '[]'$ Anemic  '[]'$ Hepatitis Gastrointestinal:  '[]'$ Blood in stool   '[]'$ Vomiting blood  '[]'$ Gastroesophageal reflux/heartburn   '[]'$ Abdominal pain Genitourinary:  '[]'$ Chronic kidney disease   '[]'$ Difficult urination  '[]'$ Frequent urination  '[]'$ Burning with urination   '[]'$ Hematuria Skin:  '[]'$ Rashes   '[]'$ Ulcers   '[]'$ Wounds Psychological:  '[]'$ History of anxiety   '[]'$  History of major depression.    Physical Exam BP 137/81 (BP Location: Right Arm)   Pulse 66   Resp 16   Wt 242 lb 9.6 oz (110 kg)   BMI 33.84 kg/m  Gen:  WD/WN,  NAD Head: [place]/AT, No temporalis wasting.  Ear/Nose/Throat: Hearing grossly intact, nares w/o erythema or drainage, oropharynx w/o Erythema/Exudate Eyes: Conjunctiva clear, sclera non-icteric  Neck: trachea midline.  No JVD.  Pulmonary:  Good air movement, respirations not labored, no use of accessory muscles  Cardiac: RRR, no JVD Vascular:  Vessel Right Left  Radial Palpable Palpable  DP Palpable Palpable  PT Palpable Palpable   Gastrointestinal:. No masses, surgical incisions, or scars. Musculoskeletal: M/S 5/5 throughout.  Extremities without ischemic changes.  No deformity or atrophy.  Prominent varicosities in the right lower leg measuring 2 to 3 mm in diameter.  Trace right lower extremity edema. Neurologic: Sensation grossly intact in extremities.  Symmetrical.  Speech is fluent. Motor exam as listed above. Psychiatric: Judgment intact, Mood & affect appropriate for pt's clinical situation. Dermatologic: No rashes or ulcers noted.  No cellulitis or open wounds.    Radiology No results found.  Labs No results found for this or any previous visit (from the past 2160 hour(s)).  Assessment/Plan:  Varicose veins of leg with pain, right Venous reflux study was performed today demonstrating significant right great saphenous vein reflux with no significant left leg reflux.  No DVT or superficial thrombophlebitis was identified.  The patient is getting no benefit from compression socks and elevation.  We discussed the role for laser ablation of the right great saphenous vein which would be a reasonable option to treat his symptomatic right lower extremity venous disease.  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  I discussed the risks and benefits of the procedure.  Patient would be interested in proceeding.  Controlled type 2 diabetes mellitus without  complication (HCC) blood glucose control important in reducing the progression of atherosclerotic disease. Also, involved in wound healing. On appropriate medications.   Hypercholesterolemia lipid control important in reducing the progression of atherosclerotic disease. Continue statin therapy      Leotis Pain 04/13/2022, 1:26 PM   This note was created with Dragon medical transcription system.  Any errors from dictation are unintentional.

## 2022-05-24 ENCOUNTER — Telehealth (INDEPENDENT_AMBULATORY_CARE_PROVIDER_SITE_OTHER): Payer: Self-pay | Admitting: Vascular Surgery

## 2022-05-24 NOTE — Telephone Encounter (Signed)
LVM for pt TCB and advise me if he has any current insurance. I am needing it to check on prior auth for laser ablation that was recommended by Dr. Lucky Cowboy.

## 2023-04-12 ENCOUNTER — Other Ambulatory Visit (INDEPENDENT_AMBULATORY_CARE_PROVIDER_SITE_OTHER): Payer: Self-pay | Admitting: Vascular Surgery

## 2023-04-12 DIAGNOSIS — I83811 Varicose veins of right lower extremities with pain: Secondary | ICD-10-CM

## 2023-04-13 ENCOUNTER — Encounter (INDEPENDENT_AMBULATORY_CARE_PROVIDER_SITE_OTHER): Payer: 59

## 2023-04-13 ENCOUNTER — Ambulatory Visit (INDEPENDENT_AMBULATORY_CARE_PROVIDER_SITE_OTHER): Payer: 59 | Admitting: Nurse Practitioner
